# Patient Record
Sex: Male | Born: 1950
Health system: Southern US, Community
[De-identification: ages and names within clinical notes are randomized; demographics above are authoritative.]

## PROBLEM LIST (undated history)

## (undated) DIAGNOSIS — D649 Anemia, unspecified: Secondary | ICD-10-CM

## (undated) DIAGNOSIS — N189 Chronic kidney disease, unspecified: Secondary | ICD-10-CM

## (undated) DIAGNOSIS — F319 Bipolar disorder, unspecified: Secondary | ICD-10-CM

## (undated) DIAGNOSIS — E785 Hyperlipidemia, unspecified: Secondary | ICD-10-CM

## (undated) DIAGNOSIS — S301XXA Contusion of abdominal wall, initial encounter: Secondary | ICD-10-CM

## (undated) DIAGNOSIS — E781 Pure hyperglyceridemia: Secondary | ICD-10-CM

## (undated) DIAGNOSIS — F329 Major depressive disorder, single episode, unspecified: Secondary | ICD-10-CM

## (undated) DIAGNOSIS — H269 Unspecified cataract: Secondary | ICD-10-CM

## (undated) DIAGNOSIS — T7840XA Allergy, unspecified, initial encounter: Secondary | ICD-10-CM

## (undated) DIAGNOSIS — K219 Gastro-esophageal reflux disease without esophagitis: Secondary | ICD-10-CM

## (undated) DIAGNOSIS — R161 Splenomegaly, not elsewhere classified: Secondary | ICD-10-CM

## (undated) DIAGNOSIS — F32A Depression, unspecified: Secondary | ICD-10-CM

## (undated) DIAGNOSIS — F4312 Post-traumatic stress disorder, chronic: Secondary | ICD-10-CM

## (undated) DIAGNOSIS — Z8739 Personal history of other diseases of the musculoskeletal system and connective tissue: Secondary | ICD-10-CM

## (undated) DIAGNOSIS — S069X9A Unspecified intracranial injury with loss of consciousness of unspecified duration, initial encounter: Secondary | ICD-10-CM

## (undated) DIAGNOSIS — F419 Anxiety disorder, unspecified: Secondary | ICD-10-CM

## (undated) HISTORY — PX: SHOULDER FOREIGN BODY REMOVAL: SUR1119

## (undated) HISTORY — DX: Anxiety disorder, unspecified: F41.9

## (undated) HISTORY — DX: Unspecified cataract: H26.9

## (undated) HISTORY — DX: Hyperlipidemia, unspecified: E78.5

## (undated) HISTORY — DX: Depression, unspecified: F32.A

## (undated) HISTORY — PX: COLONOSCOPY: SHX174

## (undated) HISTORY — DX: Anemia, unspecified: D64.9

## (undated) HISTORY — DX: Allergy, unspecified, initial encounter: T78.40XA

## (undated) HISTORY — DX: Chronic kidney disease, unspecified: N18.9

## (undated) HISTORY — DX: Gastro-esophageal reflux disease without esophagitis: K21.9

## (undated) HISTORY — PX: LEG SURGERY: SHX1003

## (undated) HISTORY — DX: Pure hyperglyceridemia: E78.1

---
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
## 2002-09-25 ENCOUNTER — Inpatient Hospital Stay (HOSPITAL_COMMUNITY): Admission: EM | Admit: 2002-09-25 | Discharge: 2002-09-29 | Payer: Self-pay | Admitting: Psychiatry

## 2002-10-09 ENCOUNTER — Inpatient Hospital Stay (HOSPITAL_COMMUNITY): Admission: EM | Admit: 2002-10-09 | Discharge: 2002-10-16 | Payer: Self-pay | Admitting: Psychiatry

## 2002-10-16 ENCOUNTER — Other Ambulatory Visit (HOSPITAL_COMMUNITY): Admission: RE | Admit: 2002-10-16 | Discharge: 2002-10-25 | Payer: Self-pay | Admitting: Psychiatry

## 2002-11-01 ENCOUNTER — Encounter: Admission: RE | Admit: 2002-11-01 | Discharge: 2002-11-01 | Payer: Self-pay | Admitting: Psychiatry

## 2002-11-15 ENCOUNTER — Encounter: Admission: RE | Admit: 2002-11-15 | Discharge: 2002-11-15 | Payer: Self-pay | Admitting: Psychiatry

## 2005-06-16 ENCOUNTER — Ambulatory Visit: Payer: Self-pay | Admitting: Internal Medicine

## 2005-06-22 ENCOUNTER — Ambulatory Visit: Payer: Self-pay | Admitting: Internal Medicine

## 2007-06-22 ENCOUNTER — Encounter: Admission: RE | Admit: 2007-06-22 | Discharge: 2007-06-22 | Payer: Self-pay | Admitting: Internal Medicine

## 2007-09-22 ENCOUNTER — Encounter: Admission: RE | Admit: 2007-09-22 | Discharge: 2007-09-22 | Payer: Self-pay | Admitting: Orthopedic Surgery

## 2007-09-25 ENCOUNTER — Encounter: Admission: RE | Admit: 2007-09-25 | Discharge: 2007-09-25 | Payer: Self-pay | Admitting: Orthopedic Surgery

## 2008-02-02 ENCOUNTER — Emergency Department (HOSPITAL_COMMUNITY): Admission: EM | Admit: 2008-02-02 | Discharge: 2008-02-03 | Payer: Self-pay | Admitting: Emergency Medicine

## 2008-02-28 ENCOUNTER — Inpatient Hospital Stay (HOSPITAL_COMMUNITY): Admission: RE | Admit: 2008-02-28 | Discharge: 2008-03-02 | Payer: Self-pay | Admitting: Orthopedic Surgery

## 2008-03-02 HISTORY — PX: TOTAL HIP ARTHROPLASTY: SHX124

## 2010-07-15 NOTE — Op Note (Signed)
NAME:  Hector Gomez, Hector Gomez NO.:  [id number]   MEDICAL RECORD NO.:  [id number]          PATIENT TYPE:  INP   LOCATION:  1607                         FACILITY:  [facility]   PHYSICIAN:  Madlyn Frankel. Charlann Boxer, M.D.  DATE OF BIRTH:  [birth year]   DATE OF PROCEDURE:  02/28/2008  DATE OF DISCHARGE:                               OPERATIVE REPORT   PREOPERATIVE DIAGNOSIS:  Left hip avascular necrosis.   POSTOPERATIVE DIAGNOSIS:  Left hip avascular necrosis.   PROCEDURE:  Left total hip replacement.   COMPONENTS USED:  DePuy hip system, size 52 Pinnacle cup, 36 neutral  liner, a 6 high Tri-Lock stem with a 36+5 ball.   SURGEON:  Madlyn Frankel. Charlann Boxer, M.D.   ASSISTANT:  Dwyane Luo, PA-C   ANESTHESIA:  General.   BLOOD LOSS:  300.   SPECIMENS:  None.   FINDINGS:  None.   COMPLICATIONS:  None.   DRAINS:  One Hemovac.   INDICATIONS FOR PROCEDURE:  [name] is a 60 year old gentleman who  presented to the office for evaluation of some persistent left proximal  thigh discomfort.  Radiographs and workup had included MRI indicating  avascular changes to the left hip.  He had failed to respond to  conservative measures and at this point wished to proceed with  arthroplasty.  Risks of infection, DVT, component failure, dislocation,  need for revision surgery and the post-operative expectations were all  discussed and reviewed.  Consent was obtained for benefit of pain  relief.   PROCEDURE IN DETAIL:  The patient was brought to the operative theater.  Once adequate anesthesia, preoperative antibiotics, Ancef administered,  the patient was positioned in the right lateral decubitus position with  left side up.  We did a time-out confirming the patient and extremity.  Left lower extremity was then pre-scrubbed, then prepped and draped in a  sterile fashion.  A lateral-based incision was made for posterior  approach to the hip.  The short external rotators were identified and  taken down,  except for the posterior capsule, after opening up the  iliotibial band and gluteal fascia.   The head was dislocated, neck osteotomy made, based off anatomic  landmarks, preoperative templating.  A large area in the anterior  superior aspect of the femoral head was noted to have subchondral  collapse and fragmented cartilage.   Attention was first directed to the femur.  Femoral canal was opened  with a drill.  We used a box osteotome to assure laterality.  I then  hand-reamed once and then irrigated the canal to prevent fat emboli.  I  began broaching with a size 2, setting my anteversion at 20-25 degrees,  then broached up to a size 6.  I used a calcar planer to finish off the  medial neck.   At this point, I packed the femur and attended to the acetabulum.  Acetabular exposure was obtained, labrum removed.  I began reaming with  a 43 reamer, then reamed up to a 51 reamer with excellent bony bed  preparation.  I subsequently impacted a 52 mm Pinnacle cup with  great  initial fixation.  Two screws were placed to supplement this.  A central  hole eliminator was placed and the final 36 metal liner impacted into  position.  Final cup position was at 40 degrees of abduction, 20 degrees  of forward flexion beneath the anterior rim.   At this point I did a trial reduction with the 6 high neck and a 36+1  ball initially.  When I did this, the hip stability was very good with  the combined anteversion at 45-50 degrees.  There were a few millimeters  of shuck, however, and the leg length appeared to be a little shorter  than the down leg.  For this reason I chose to up-size it to a 5 mm  ball, which gave me 1.5 mm of length.   When I did this it tightened up the hip nicely.  Leg lengths appeared to  be equal with this 6 mm length.  The hip was very stable without  evidence of impingement.   At this point we removed all trial components.  The canal was irrigated  and the final 6 high  Tri-Lock stem was then impacted at the level where  the broach was.  Based on this and my previous trials, the 36+5 ball was  opened and impacted onto a cleaned and dried trunnion and the hip  reduced.  We irrigated the hip throughout the case.  Again at this point  there was no active bleeding that required hemostasis.  I reapproximated  the posterior capsule, the superior leaflet.  I then placed a medium  Hemovac drain deep.  The iliotibial band and gluteal fascia were  reapproximated using #1 Vicryl.  The remainder of the wound was closed  with 2-0 Vicryl and 4-0 running Monocryl.  Hip was cleaned, dried and  dressed sterilely with Steri-Strips and a Mepilex dressing and he was  brought to the recovery room in stable condition, tolerated the  procedure well.      Madlyn Frankel Charlann Boxer, M.D.  Electronically Signed     MDO/MEDQ  D:  02/28/2008  T:  02/29/2008  Job:  [id number]

## 2010-07-15 NOTE — H&P (Signed)
NAME:  Hector Gomez, Hector Gomez NO.:  [id number]   MEDICAL RECORD NO.:  [id number]          PATIENT TYPE:  INP   LOCATION:                               FACILITY:  [facility]   PHYSICIAN:  Madlyn Frankel. Charlann Boxer, M.D.  DATE OF BIRTH:  09-Oct-1950   DATE OF ADMISSION:  02/28/2008  DATE OF DISCHARGE:                              HISTORY & PHYSICAL   PROCEDURE:  Left total hip replacement.   CHIEF COMPLAINT:  Left hip pain.   HISTORY OF PRESENT ILLNESS:  A 60 year old male with a history of left  hip pain secondary to avascular necrosis.  It has been refractory to all  conservative treatment.  Presurgically assessed prior to surgery.   PRIMARY CARE PHYSICIAN:  [name], MD.   PAST MEDICAL HISTORY:  1. Avascular necrosis.  2. Chemical imbalance.   ALLERGIES:  NO KNOWN DRUG ALLERGIES.   MEDICATIONS:  1. Lamictal, unknown dosage amount.  2. Ambien 10 mg p.o. q.h.s.  3. Clonazepam 0.5 mg p.r.n.  4. Trazodone p.r.n.  5. Risperdal, unknown dosage amount.  6. Xanax 0.5 mg p.o. p.r.n. for anxiety.  7. Tricor daily, unknown dosage amount.  8. Crestor, unknown dosage amount twice weekly.   Verify dosage and frequencies with the patient at time of admission.   PAST SURGICAL HISTORY:  None.   FAMILY HISTORY:  Lung cancer.   SOCIAL HISTORY:  He is separated.  He is planning on postoperative rehab  with home health care physical therapy at home.   REVIEW OF SYSTEMS:  None other than in the HPI.   PHYSICAL EXAMINATION:  VITAL SIGNS:  Pulse 72, respirations 16, blood  pressure 116/76.  GENERAL:  Awake, alert, oriented, well-developed, well-nourished.  HEENT:  Normocephalic.  NECK:  Supple.  No carotid bruits.  CHEST/LUNGS:  Clear to auscultation bilaterally.  BREASTS:  Deferred.  HEART:  Regular rate and rhythm.  S1-S2 distinct.  ABDOMEN:  Bowel sounds are present.  PELVIS:  Stable.  GENITOURINARY:  Deferred.  EXTREMITIES:  Left hip decreased range of motion, increased  pain.  SKIN:  No cellulitis.  NEUROLOGIC:  Intact distal sensibilities.   LABORATORY DATA:  Labs, EKG, chest x-ray are all pending presurgical  testing.   IMPRESSION:  Left hip avascular necrosis.   PLAN OF ACTION:  Left total hip replacement February 28, 2008 at [facility].  Risks and complications were discussed.   SURGEON:  Madlyn Frankel. Charlann Boxer, M.D.   POSTOPERATIVE MEDICATIONS:  Provided at time of history and physical  today which included:  1. Aspirin.  2. Robaxin.  3. Iron.  4. Colace.  5. MiraLax.  6. Ambien.  7. Celebrex.     ______________________________  Yetta Glassman. [address], [place]      Madlyn Frankel. Charlann Boxer, M.D.  Electronically Signed    BLM/MEDQ  D:  02/01/2008  T:  02/01/2008  Job:  [id number]   cc:   [name], MD  Fax: [phone]

## 2010-07-18 NOTE — Discharge Summary (Signed)
NAME:  Hector Gomez, Hector Gomez NO.:  [id number]   MEDICAL RECORD NO.:  [id number]          PATIENT TYPE:  INP   LOCATION:  1607                         FACILITY:  [facility]   PHYSICIAN:  Madlyn Frankel. Charlann Boxer, M.D.  DATE OF BIRTH:  11-28-1950   DATE OF ADMISSION:  02/28/2008  DATE OF DISCHARGE:  03/02/2008                               DISCHARGE SUMMARY   ADMITTING DIAGNOSES:  1. Avascular necrosis.  2. Chemical imbalance.   DISCHARGE DIAGNOSES:  1. Avascular necrosis.  2. Chemical imbalance.   HISTORY OF PRESENT ILLNESS:  A 60 year old male with a history of left  hip pain secondary to avascular necrosis.  Refractory to all  conservative treatment.   CONSULTS:  None.   PROCEDURE:  Left total hip replacement by surgeon, Dr. Durene Romans.  Assistant, Dwyane Luo P.A.-C.   LABORATORY DATA:  CBC, final reading, white blood cell count 4.9,  hemoglobin 10, hematocrit 29.6, platelets 200.  White cell differential  normal.  Coags normal.  Metabolic panel, final reading, sodium 142,  potassium 4.9, glucose 79, BUN was 11, creatinine 1.4.  Calcium 8.5.  UA  negative.   HOSPITAL COURSE:  Patient underwent a left total hip replacement,  tolerated procedure well.  Stay was unremarkable.  He remained  hemodynamically and orthopedically stable.  Dressing was changed.  There  was no drainage from the wound.  He was weightbearing as tolerated and  made good progress with physical therapy and was stable after 2 days of  pain well controlled.   DISCHARGE DISPOSITION:  Discharged home with home health care PT, stable  and improved condition.   DISCHARGE PHYSICAL THERAPY:  Weightbearing as tolerated with the use  rolling walker.   DISCHARGE DIET:  Heart healthy.   DISCHARGE WOUND CARE:  Keep dry.   DISCHARGE MEDICATIONS:  1. Aspirin 325 mg 1 p.o. b.i.d.  2. Robaxin 500 mg p.o. q.6.  3. Iron 325 mg p.o. t.i.d.  4. Colace 100 mg p.o. b.i.d.  5. MiraLax 17 g p.o. daily.  6. Norco  7.5/325 one to two p.o. q.4 to 6 p.r.n. pain.  7. Celebrex 200 mg 1 p.o. b.i.d. x2 weeks.  8. Crestor 5 mg 1 on Friday and 1 on Monday.  9. Trazodone 100 mg 1 to 2 tabs q.h.s. p.r.n.  10.Alprazolam 0.5 mg 1 to 2 daily p.r.n.  11.Risperidone 1 mg q.h.s.  12.Tricor 145 mg p.o. q.h.s.  13.Ambien CR 12.5 mg p.o. q.h.s.  14.Lamotrigine 200 mg p.o. q.h.s.  15.Clonazepam 0.5 mg q.h.s.   DISCHARGE FOLLOWUP:  Follow up with Dr. Charlann Boxer at phone number [phone] in  2 weeks for wound check.     ______________________________  Yetta Glassman. [address], [place]      Madlyn Frankel. Charlann Boxer, M.D.  Electronically Signed    BLM/MEDQ  D:  03/27/2008  T:  03/27/2008  Job:  [id number]   cc:   [name], MD  Fax: [phone]
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
## 2010-07-18 NOTE — Discharge Summary (Signed)
NAME:  Hector Gomez, Hector Gomez NO.:  [id number]   MEDICAL RECORD NO.:  [id number]                   PATIENT TYPE:  IPS   LOCATION:  0302                                 [facility]ILITY[facility]  [facility]   PHYSICIAN:  Jeanice Lim, M.D.              DATE OF BIRTH:  1950/08/04   DATE OF ADMISSION:  10/09/2002  DATE OF DISCHARGE:  10/16/2002                                 DISCHARGE SUMMARY   IDENTIFYING DATA:  This is a 60 year old Caucasian male, divorced,  involuntarily admitted, petitioned by aunt for strange, threatening  behavior, ringing doorbells in the neighborhood, actually entering one  residence without permission.  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  He was  having clear mood swings and not taking his medications, and felt that he  had been saved and did not need medications.  He was irritable, impulsive,  and had very impaired judgment at the time of admission, and family members  and friends were concerned about him.   MEDICATIONS:  Patient had been noncompliant with his Zyprexa, had taken [facility].  John's Wort intermittently in the past, but not recently as per patient, and  denied other drug use.   ALLERGIES:  No known drug allergies.   PHYSICAL EXAMINATION:  Within normal limits.  Neurologic:  Nonfocal.   ROUTINE ADMISSION LABORATORIES:  Essentially within normal limits.   MENTAL STATUS EXAM:  Fully alert male.  Anxious affect.  Mildly irritable,  but redirectable sitting still, fidgeting somewhat.  Speech:  Mildly  pressured, hyperverbal.  Mood:  Elevated, hyperreligious, __________  grandiosity.  Sees no problem in his behaviors and feels that maybe his  family or friends or neighbors needed to be evaluated rather than him.  Felt  there might be a plot against him and that he was maybe being poisoned.  His  cognition was intact.  Judgment and insight poor, impulse control impaired.  Mood  was clearly labile with racing thoughts at time and some tangential  thoughts with partial insight able to gather himself for at least short  periods of time.   ADMISSION DIAGNOSES:   AXIS I:  Bipolar I disorder, manic.   AXIS II:  None.   AXIS III:  None.   AXIS IV:  Moderate, related to limited support system, stressors at job, and  recent onset of mood disorder.   AXIS V:  19/75-80.   HOSPITAL COURSE:  Patient was admitted and ordered routine p.r.n.  medications, underwent further monitoring.  Was encouraged to participate in  individual, group, and __________ therapy.  Patient was resumed on Zyprexa  and then Zyprexa was optimized due to clear delusional thinking and  moveability.  Patient was ordered lithium and due to increase in  triglycerides, which were significantly elevated, patient was changed to  Risperdal and patient was started on Tricor.  Patient was to follow up with  Willaim Sheng for therapy and to continue to follow up with Dr. Kathrynn Running,  which he had not kept his appointment prior to this admission.  He reported  motivation to be compliant, reported a positive response to medication.  His  mood clearly stabilized.  His thought process was more goal-directed and he  was completely reality-based with no evidence of psychotic symptoms at the  time of discharge.  Risk, benefit ratio and alternative treatments regarding  medications were discussed as well as side effects from underlying case.  Patient was comfortable with this medication regimen, although he clearly  did not like to take medicine.  He was discharged on Risperdal 10 mg at  bedtime, Ambien 10 mg q.h.s. p.r.n., and Tricor 54 mg q.a.m.  Patient did  not want to take lithium and seemed to respond to the Risperdal, and  therefore was discharged to follow up with his primary care physician  regarding elevation of triglycerides and the [facility] for  intensive outpatient until further  stabilized starting on August 17 and  August 30.   DISCHARGE DIAGNOSES:   AXIS I:  Bipolar I disorder, manic.   AXIS II:  None.   AXIS III:  None.   AXIS IV:  Moderate, related to limited support system, stressors at job, and  recent onset of mood disorder.   AXIS V:  GAF is 50-55.                                                 Jeanice Lim, M.D.    Lovie Macadamia  D:  11/06/2002  T:  11/07/2002  Job:  [id number]
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
## 2010-12-05 LAB — DIFFERENTIAL
Basophils Absolute: 0 10*3/uL (ref 0.0–0.1)
Basophils Absolute: 0 10*3/uL (ref 0.0–0.1)
Basophils Relative: 1 % (ref 0–1)
Eosinophils Absolute: 0.1 10*3/uL (ref 0.0–0.7)
Eosinophils Relative: 1 % (ref 0–5)
Eosinophils Relative: 3 % (ref 0–5)
Lymphocytes Relative: 17 % (ref 12–46)
Monocytes Absolute: 0.3 10*3/uL (ref 0.1–1.0)
Monocytes Absolute: 0.7 10*3/uL (ref 0.1–1.0)

## 2010-12-05 LAB — URINALYSIS, ROUTINE W REFLEX MICROSCOPIC
Bilirubin Urine: NEGATIVE
Bilirubin Urine: NEGATIVE
Glucose, UA: NEGATIVE mg/dL
Hgb urine dipstick: NEGATIVE
Ketones, ur: NEGATIVE mg/dL
Specific Gravity, Urine: 1.015 (ref 1.005–1.030)
Urobilinogen, UA: 0.2 mg/dL (ref 0.0–1.0)
pH: 5.5 (ref 5.0–8.0)

## 2010-12-05 LAB — CBC
HCT: 29.6 % — ABNORMAL LOW (ref 39.0–52.0)
HCT: 31.6 % — ABNORMAL LOW (ref 39.0–52.0)
HCT: 38.9 % — ABNORMAL LOW (ref 39.0–52.0)
Hemoglobin: 10 g/dL — ABNORMAL LOW (ref 13.0–17.0)
Hemoglobin: 13.3 g/dL (ref 13.0–17.0)
MCHC: 34.1 g/dL (ref 30.0–36.0)
MCV: 88.5 fL (ref 78.0–100.0)
Platelets: 200 10*3/uL (ref 150–400)
Platelets: 237 10*3/uL (ref 150–400)
Platelets: 274 10*3/uL (ref 150–400)
RBC: 3.35 MIL/uL — ABNORMAL LOW (ref 4.22–5.81)
RBC: 4.43 MIL/uL (ref 4.22–5.81)
RDW: 12.2 % (ref 11.5–15.5)
WBC: 4.9 10*3/uL (ref 4.0–10.5)
WBC: 5.1 10*3/uL (ref 4.0–10.5)
WBC: 5.6 10*3/uL (ref 4.0–10.5)

## 2010-12-05 LAB — BASIC METABOLIC PANEL
BUN: 11 mg/dL (ref 6–23)
BUN: 9 mg/dL (ref 6–23)
CO2: 26 mEq/L (ref 19–32)
Calcium: 9.3 mg/dL (ref 8.4–10.5)
Chloride: 112 mEq/L (ref 96–112)
GFR calc non Af Amer: 53 mL/min — ABNORMAL LOW (ref >60)
Glucose, Bld: 86 mg/dL (ref 70–99)
Potassium: 3.7 mEq/L (ref 3.5–5.1)
Potassium: 4 mEq/L (ref 3.5–5.1)
Potassium: 4.2 mEq/L (ref 3.5–5.1)
Sodium: 133 mEq/L — ABNORMAL LOW (ref 135–145)
Sodium: 140 mEq/L (ref 135–145)
Sodium: 142 mEq/L (ref 135–145)

## 2010-12-05 LAB — COMPREHENSIVE METABOLIC PANEL
ALT: 21 U/L (ref 0–53)
AST: 20 U/L (ref 0–37)
Albumin: 3.8 g/dL (ref 3.5–5.2)
Alkaline Phosphatase: 68 U/L (ref 39–117)
Chloride: 103 mEq/L (ref 96–112)
Creatinine, Ser: 1.75 mg/dL — ABNORMAL HIGH (ref 0.4–1.5)
GFR calc Af Amer: 49 mL/min — ABNORMAL LOW (ref >60)
Potassium: 3.7 mEq/L (ref 3.5–5.1)
Sodium: 136 mEq/L (ref 135–145)
Total Bilirubin: 0.9 mg/dL (ref 0.3–1.2)

## 2010-12-05 LAB — TYPE AND SCREEN

## 2010-12-05 LAB — ABO/RH: ABO/RH(D): O POS

## 2011-10-02 ENCOUNTER — Telehealth: Payer: Self-pay | Admitting: Hematology and Oncology

## 2011-10-02 NOTE — Telephone Encounter (Signed)
Referred by Dr. Creola Corn Dx- Leukopenia. NP packet mailed out.

## 2011-10-02 NOTE — Telephone Encounter (Signed)
S/w pt today re appt for 8/15 @ 9:30 am w/LO

## 2011-10-05 ENCOUNTER — Telehealth: Payer: Self-pay | Admitting: Hematology and Oncology

## 2011-10-05 NOTE — Telephone Encounter (Signed)
Ref. Hector Gomez Dx.Leukocytopenia Del.10/05/11

## 2011-10-15 ENCOUNTER — Ambulatory Visit: Payer: 59

## 2011-10-15 ENCOUNTER — Ambulatory Visit (HOSPITAL_BASED_OUTPATIENT_CLINIC_OR_DEPARTMENT_OTHER): Payer: 59 | Admitting: Hematology and Oncology

## 2011-10-15 ENCOUNTER — Encounter: Payer: Self-pay | Admitting: Hematology and Oncology

## 2011-10-15 ENCOUNTER — Telehealth: Payer: Self-pay | Admitting: Hematology and Oncology

## 2011-10-15 ENCOUNTER — Ambulatory Visit (HOSPITAL_BASED_OUTPATIENT_CLINIC_OR_DEPARTMENT_OTHER): Payer: 59 | Admitting: Lab

## 2011-10-15 VITALS — BP 118/76 | HR 80 | Temp 97.0°F | Resp 20 | Ht 68.0 in | Wt 169.1 lb

## 2011-10-15 DIAGNOSIS — D72819 Decreased white blood cell count, unspecified: Secondary | ICD-10-CM | POA: Insufficient documentation

## 2011-10-15 DIAGNOSIS — F319 Bipolar disorder, unspecified: Secondary | ICD-10-CM | POA: Insufficient documentation

## 2011-10-15 LAB — CBC WITH DIFFERENTIAL/PLATELET
Basophils Absolute: 0 10*3/uL (ref 0.0–0.1)
Eosinophils Absolute: 0.1 10*3/uL (ref 0.0–0.5)
HGB: 12.9 g/dL — ABNORMAL LOW (ref 13.0–17.1)
MCV: 83.4 fL (ref 79.3–98.0)
MONO#: 0.3 10*3/uL (ref 0.1–0.9)
MONO%: 14.6 % — ABNORMAL HIGH (ref 0.0–14.0)
NEUT#: 0.6 10*3/uL — ABNORMAL LOW (ref 1.5–6.5)
RBC: 4.49 10*6/uL (ref 4.20–5.82)
RDW: 14.3 % (ref 11.0–14.6)
WBC: 1.9 10*3/uL — ABNORMAL LOW (ref 4.0–10.3)

## 2011-10-15 LAB — MORPHOLOGY: PLT EST: ADEQUATE

## 2011-10-15 NOTE — Progress Notes (Signed)
CC:   [name], MD  IDENTIFYING STATEMENT:  The patient is a 61 year old man seen at request of [name] with leukopenia.  HISTORY OF PRESENT ILLNESS:  The patient recalls being told that his white cell count was trending down a little 6 months ago.  Review of patient's chart notes the following:  03/01/2008 white cell count 4.9, hemoglobin 10, hematocrit 29.6, platelets 200; 09/25/2011 white cell count 2.3, hemoglobin 12.4, hematocrit 35, platelets 213, AST 700.  The patient denies a history of chronic infections specifically skin, upper respiratory tract infection, and gum disease.  He denies fever, chills, or night sweats.  He denies adenopathy.  He has not lost weight.  He has been on psych medications for years and has had no recent changes.  He denies taking over-the-counter herbal remedies.  He denies muscle aches, joint pain, or arthralgia.  He does have a history of avascular necrosis of left hip and did undergo left hip replacement.  He is not sure why this was so.  Of note, I did review a CT scan of the abdomen that he had performed in 2009 which showed that the spleen was enlarged measuring 13.8 x 10 x 14.1.  The liver was unremarkable.  There was no significant lymphadenopathy.  He denies history of alcohol use.  PAST MEDICAL HISTORY: 1. History of avascular necrosis.  2.  Status post left hip     replacement.  3.  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  4.  Hyperlipidemia.  5.  Allergic     rhinitis.  6.  GERD.  7.  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  ALLERGIES:  None.  MEDICATIONS:  Trazodone one daily p.r.n., Ambien 12.5 mg q.h.s., lorazepam 0.5 mg q.h.s., Risperdal 0.5 mg q.h.s., and Xanax 0.5 mg p.r.n.  SOCIAL HISTORY:  The patient is separated with 1 child.  He is a former smoker, smoking less than 1 pack a day, quitting in 2008.  He denies excessive alcohol use.  He is a Merchandiser, retail of a  Physiological scientist.  FAMILY HISTORY:  The patient's father had lung cancer.  Denies hematological disorders.  HEALTH MAINTENANCE:  Received a colonoscopy 5 years ago.  Has close followup with [name].  REVIEW OF SYSTEMS:  Denies fever, chills, night sweats, anorexia, weight loss, pain.  GI:  Denies nausea, vomiting, abdominal pain, diarrhea, melena, hematochezia.  GU:  Denies dysuria, hematuria, nocturia, frequency.  Cardiovascular:  Denies chest pain, PND, orthopnea, ankle swelling.  Respirations:  Denies cough, hemoptysis, wheeze, shortness of breath.  Musculoskeletal:  Presently denies joint aches, muscle pains. Neurologic:  Denies headache, vision change, extremity weakness.  PHYSICAL EXAMINATION:  General:  The patient is a well-appearing, well- nourished man in no distress.  Vitals:  Pulse 80, blood pressure 118/76, temperature 97, respirations 20, weight 169 pounds.  HEENT:  Head is atraumatic, normocephalic.  Extraocular muscles intact.  Sclerae anicteric.  Pupils equal, round, and reactive to light.  Mouth moist without ulcerations, thrush, or lesions.  Neck:  Supple.  Chest:  Clear to percussion and auscultation.  CVS:  Unremarkable.  Abdomen:  Soft, nontender.  Bowel sounds present.  Extremities:  No edema.  Skin:  No bruising.  CNS:  Nonfocal.  IMPRESSION AND PLAN:  Mr. Hector Gomez is a pleasant 61 year old man with mild leukopenia.  A remote CT scan did note an enlarged spleen.  I will have  him repeat an ultrasound of his liver and spleen and possibly repeat a CT scan if needed at later stage.  I will have him repeat a CBC with diff.  I will review morphology.  We will obtain an ANA and C-ANCA. The patient has a history of avascular necrosis of his left hip and may have underlying connective tissue disease.  We will obtain a baseline SPEP with IFE to screen for mild dyscrasia.  I reviewed his medication list and both drugs, Risperdal and clonazepam, can cause  leukopenia with anemia.  The patient is presently asymptomatic.  I have him returning to discuss results in a week and a half's time and at that time further recommendations will follow.  I spent more than half the time coordinating care.    ______________________________ Laurice Record, M.D. LIO/MEDQ  D:  10/15/2011  T:  10/15/2011  Job:  [id number]

## 2011-10-15 NOTE — Patient Instructions (Signed)
Hector Gomez  [id number]  [facility] Discharge Instructions  RECOMMENDATIONS MADE BY THE CONSULTANT AND ANY TEST RESULTS WILL BE SENT TO YOUR REFERRING DOCTOR.   EXAM FINDINGS BY MD TODAY AND SIGNS AND SYMPTOMS TO REPORT TO CLINIC OR PRIMARY MD:   Your current list of medications are: Current Outpatient Prescriptions  Medication Sig Dispense Refill  . ALPRAZolam (XANAX) 0.5 MG tablet Take 0.5 mg by mouth as needed.      . clonazePAM (KLONOPIN) 0.5 MG tablet Take 0.5 mg by mouth at bedtime.      . risperiDONE (RISPERDAL) 0.5 MG tablet Take 0.5 mg by mouth at bedtime.      . TRAZODONE HCL PO Take 1 tablet by mouth as needed.      . zolpidem (AMBIEN CR) 12.5 MG CR tablet Take 12.5 mg by mouth at bedtime.         INSTRUCTIONS GIVEN AND DISCUSSED:   SPECIAL INSTRUCTIONS/FOLLOW-UP:  See above.  I acknowledge that I have been informed and understand all the instructions given to me and received a copy. I do not have any more questions at this time, but understand that I may call the [facility] at [phone] during business hours should I have any further questions or need assistance in obtaining follow-up care.

## 2011-10-15 NOTE — Telephone Encounter (Signed)
gv pt appt schedule for August including appt for [place] 8/26.

## 2011-10-15 NOTE — Progress Notes (Signed)
Dr.    Timothy Lasso      -     Primary  [facility]   On  [address]  Cell    Phone     [phone].

## 2011-10-19 LAB — ANA: Anti Nuclear Antibody(ANA): NEGATIVE

## 2011-10-19 LAB — COMPREHENSIVE METABOLIC PANEL
Albumin: 4 g/dL (ref 3.5–5.2)
Alkaline Phosphatase: 84 U/L (ref 39–117)
BUN: 10 mg/dL (ref 6–23)
CO2: 27 mEq/L (ref 19–32)
Calcium: 9.2 mg/dL (ref 8.4–10.5)
Chloride: 107 mEq/L (ref 96–112)
Glucose, Bld: 89 mg/dL (ref 70–99)
Potassium: 4 mEq/L (ref 3.5–5.3)
Sodium: 141 mEq/L (ref 135–145)
Total Protein: 7.3 g/dL (ref 6.0–8.3)

## 2011-10-19 LAB — PROTEIN ELECTROPHORESIS, SERUM
Albumin ELP: 52.6 % — ABNORMAL LOW (ref 55.8–66.1)
Alpha-1-Globulin: 4.2 % (ref 2.9–4.9)
Beta 2: 8.5 % — ABNORMAL HIGH (ref 3.2–6.5)
Total Protein, Serum Electrophoresis: 7.3 g/dL (ref 6.0–8.3)

## 2011-10-19 LAB — ANCA TITERS

## 2011-10-19 LAB — ANCA SCREEN W REFLEX TITER: Atypical p-ANCA Screen: NEGATIVE

## 2011-10-26 ENCOUNTER — Other Ambulatory Visit (HOSPITAL_COMMUNITY): Payer: 59

## 2011-10-27 ENCOUNTER — Ambulatory Visit (HOSPITAL_COMMUNITY)
Admission: RE | Admit: 2011-10-27 | Discharge: 2011-10-27 | Disposition: A | Discharge status: Home / Self Care | Payer: 59 | Source: Ambulatory Visit | Attending: Hematology and Oncology | Admitting: Hematology and Oncology

## 2011-10-27 DIAGNOSIS — D709 Neutropenia, unspecified: Secondary | ICD-10-CM | POA: Insufficient documentation

## 2011-10-27 DIAGNOSIS — R161 Splenomegaly, not elsewhere classified: Secondary | ICD-10-CM | POA: Insufficient documentation

## 2011-10-27 DIAGNOSIS — D72819 Decreased white blood cell count, unspecified: Secondary | ICD-10-CM

## 2011-10-28 ENCOUNTER — Ambulatory Visit (HOSPITAL_BASED_OUTPATIENT_CLINIC_OR_DEPARTMENT_OTHER): Payer: 59 | Admitting: Hematology and Oncology

## 2011-10-28 ENCOUNTER — Other Ambulatory Visit (HOSPITAL_COMMUNITY)
Admission: RE | Admit: 2011-10-28 | Discharge: 2011-10-28 | Disposition: A | Discharge status: Home / Self Care | Payer: 59 | Source: Ambulatory Visit | Attending: Hematology and Oncology | Admitting: Hematology and Oncology

## 2011-10-28 ENCOUNTER — Encounter: Payer: Self-pay | Admitting: Hematology and Oncology

## 2011-10-28 ENCOUNTER — Telehealth: Payer: Self-pay | Admitting: Hematology and Oncology

## 2011-10-28 ENCOUNTER — Ambulatory Visit (HOSPITAL_BASED_OUTPATIENT_CLINIC_OR_DEPARTMENT_OTHER): Payer: 59 | Admitting: Lab

## 2011-10-28 VITALS — BP 141/92 | HR 90 | Temp 98.3°F | Resp 18 | Ht 68.0 in | Wt 167.7 lb

## 2011-10-28 DIAGNOSIS — D72819 Decreased white blood cell count, unspecified: Secondary | ICD-10-CM

## 2011-10-28 DIAGNOSIS — R161 Splenomegaly, not elsewhere classified: Secondary | ICD-10-CM

## 2011-10-28 DIAGNOSIS — D709 Neutropenia, unspecified: Secondary | ICD-10-CM

## 2011-10-28 HISTORY — DX: Splenomegaly, not elsewhere classified: R16.1

## 2011-10-28 LAB — VITAMIN B12: Vitamin B-12: 322 pg/mL (ref 211–911)

## 2011-10-28 NOTE — Telephone Encounter (Signed)
Gave pt appt for September 2013 lab , BMBX, and sent pt lab

## 2011-10-28 NOTE — Patient Instructions (Signed)
Hector Gomez  [id number]   [facility] - AFTER VISIT SUMMARY   **RECOMMENDATIONS MADE BY THE CONSULTANT AND ANY TEST    RESULTS WILL BE SENT TO YOUR REFERRING DOCTORS.   YOUR EXAM FINDINGS, LABS AND RESULTS WERE DISCUSSED BY YOUR MD TODAY.    Your vital signs are: Filed Vitals:   10/28/11 1422  BP: 141/92  Pulse: 90  Temp: 98.3 F (36.8 C)  Resp: 18    Your Updated drug allergies are: Allergies as of 10/28/2011 - Review Complete 10/28/2011  Allergen Reaction Noted  . Adhesive (tape) Rash 10/28/2011    Your current list of medications are: Current Outpatient Prescriptions  Medication Sig Dispense Refill  . ALPRAZolam (XANAX) 0.5 MG tablet Take 0.5 mg by mouth as needed.      . clonazePAM (KLONOPIN) 0.5 MG tablet Take 0.5 mg by mouth at bedtime.      . fenofibrate (TRICOR) 145 MG tablet Take 145 mg by mouth daily.      . Omega-3 Fatty Acids (FISH OIL PO) Take 1,400 mg by mouth daily.      . risperiDONE (RISPERDAL) 0.5 MG tablet Take 0.5 mg by mouth at bedtime.      . rosuvastatin (CRESTOR) 10 MG tablet Take 10 mg by mouth 2 (two) times a week. Monday and Friday      . TRAZODONE HCL PO Take 1 tablet by mouth as needed.      . zolpidem (AMBIEN CR) 12.5 MG CR tablet Take 12.5 mg by mouth at bedtime.         INSTRUCTIONS GIVEN AND DISCUSSED:  See attached schedule   SPECIAL INSTRUCTIONS/FOLLOW-UP:  See above.  I acknowledge that I have been informed and understand all the instructions given to me and received a copy. I do not have any more questions at this time, but understand that I may call the [facility] at [phone] during business hours should I have any further questions or need assistance in obtaining follow-up care.

## 2011-10-28 NOTE — Progress Notes (Signed)
This office note has been dictated.

## 2011-10-28 NOTE — Progress Notes (Signed)
Pt is scheduled for Bone Marrow Biopsy on  11/06/11  At  1030 am.   Both Margaret in pathology lab and Marcelino Duster, infusion scheduler have been notified.  Verbal and written instructions given to pt.   Pt voiced understanding.

## 2011-10-28 NOTE — Progress Notes (Signed)
CC:   [name], MD [name], M.D.  IDENTIFYING STATEMENT:  The patient is a 61 year old man who presented to discuss results.  INTERVAL HISTORY:  In summary, the patient was referred for progressive leukopenia.  He remains asymptomatic with no recent history for infection,  skin or gum disease.  Denied B symptoms.  He does have a history for avascular necrosis of hip and is status post hip replacement.  He has does have on Splenomegaly documented on previous CT scan.  RESULTS:  Note the following:  10/15/2011 white cell count 1.9, hemoglobin 12.9, hematocrit 37.4, platelets 202, ANC 600.  Morphology noted slight polychromasia.  White blood cells essentially unremarkable. Protein electrophoresis did not detect an M spike.  ANA negative but p- ANCA screen was positive and p-ANCA titer was high at 1:640.  Ultrasound on 10/27/2011 noted a grossly normal liver with normal echogenicity.  No masses or nodularity identified.  The spleen appeared enlarged measuring 12.8 cm.  No focal mass lesions were noted.  MEDICATIONS:  Trazodone, Risperdal, Xanax, Klonopin, TriCor, fish oil, Crestor, Ambien.  ALLERGIES:  Adhesive tape.  PAST MEDICAL HISTORY/FAMILY HISTORY/SOCIAL HISTORY:  Unchanged.  REVIEW OF SYSTEMS:  Ten-point review of systems negative.  Specifically, no history for arthritis.  PHYSICAL EXAM:  General:  Patient is alert and oriented x3.  Vitals: Pulse 90, blood pressure 141/92, temp 98.3, respirations 18, weight 167.7 pounds.  HEENT:  Head is atraumatic, normocephalic.  Sclerae anicteric.  Mouth moist.  Neck:  Supple.  Chest/CVS:  Unremarkable. Abdomen:  Soft, nontender.  Spleen not palpable.  Bowel sounds present. Extremities:  No calf tenderness or edema.  LABORATORY DATA:  As above.  In addition, sodium 141, potassium 4, chloride 107, CO2 27, BUN 10, creatinine 1.28, glucose 89.  T Bili 0.4, alkaline phosphatase 84, AST 15, ALT 11, calcium 9.2.  IMPRESSION AND  PLAN:  Mr. Murguia is a 61 year old man with neutropenia, splenomegaly, and high titers of p-ANCA.  Status post left hip replacement for avascular necrosis.  Part of the differential includes Felty's syndrome versus drug reaction, i.e., Risperdal and clonazepam can cause leukopenia versus infection, versus myeloproliferative disorder.  I would recommend that we proceed with a bone marrow biopsy to investigate if there are any underlying issues for bone marrow effects.  I also suggest that we obtain peripheral blood for flow to look for large granulocytic leukemia cells if any.  We will also obtain HIV and vitamin B12 level.  The patient has been scheduled to return for bone marrow exam on November 06, 2011.  I have also asked him  to touch base with [name] his psychiatrist to address his medications. He needs  feel that he needs to touch base with [name] regarding connective tissue disorder.    ______________________________ Laurice Record, M.D. LIO/MEDQ  D:  10/28/2011  T:  10/28/2011  Job:  [id number]

## 2011-11-04 LAB — FLOW CYTOMETRY

## 2011-11-06 ENCOUNTER — Other Ambulatory Visit (HOSPITAL_COMMUNITY)
Admission: RE | Admit: 2011-11-06 | Discharge: 2011-11-06 | Disposition: A | Discharge status: Home / Self Care | Payer: 59 | Source: Ambulatory Visit | Attending: Hematology and Oncology | Admitting: Hematology and Oncology

## 2011-11-06 ENCOUNTER — Ambulatory Visit (HOSPITAL_BASED_OUTPATIENT_CLINIC_OR_DEPARTMENT_OTHER): Payer: 59 | Admitting: Hematology and Oncology

## 2011-11-06 ENCOUNTER — Encounter: Payer: 59 | Admitting: Hematology and Oncology

## 2011-11-06 ENCOUNTER — Other Ambulatory Visit: Payer: 59 | Admitting: Lab

## 2011-11-06 ENCOUNTER — Other Ambulatory Visit: Payer: Self-pay | Admitting: *Deleted

## 2011-11-06 VITALS — BP 144/91 | HR 76 | Temp 97.1°F

## 2011-11-06 DIAGNOSIS — D709 Neutropenia, unspecified: Secondary | ICD-10-CM

## 2011-11-06 DIAGNOSIS — D72819 Decreased white blood cell count, unspecified: Secondary | ICD-10-CM

## 2011-11-06 DIAGNOSIS — D61818 Other pancytopenia: Secondary | ICD-10-CM | POA: Insufficient documentation

## 2011-11-06 NOTE — Procedures (Signed)
BONE MARROW PROCEDURE NOTE  Following consent, the right skin and iliac crest were cleaned and anesthestized with 2% lidocaine.  A bone marrow and biopsy were performed on separate sites of the iliac crest.  The patient tolerated the procedure well.  The were no complications.  The area was pressure dressed.  The patient was told to keep the area clean and dry for at least 24 hours.  He was to report excessive bleeding. The reason for the bone marrow was to evaluate persistent neutropenia.  The patient follows up to discuss results.

## 2011-11-06 NOTE — Progress Notes (Signed)
1211: Dressing dry and intact. Pt states he is feeling OK.

## 2011-11-06 NOTE — Patient Instructions (Addendum)
[facility] Discharge Instructions for Post Bone Marrow Procedure  Today you had a bone marrow biopsy and aspirate of  Right hip.  Please keep the pressure dressing in place for at least 24 hours.  Have someone check your dressing periodically for bleeding.  If needed you can reapply a pressure dressing to the site.  Take pain medication  Tylenol  as directed.  IF BLEEDING REOCCURS THAT SHOULD BE REPORTED IMMEDIATELY. Call the Cancer Center at [phone] if during business hours. Or report to the Emergency Room.   I have been informed and understand all the instructions given to me. I know to contact the clinic, my physician, or go to the Emergency Department if any problems should occur. I do not have any questions at this time, but understand that I may call the clinic during office hours at [phone]  should I have any questions or need assistance in obtaining follow up care.    __________________________________________  _____________  __________ Signature of Patient or Authorized Representative            Date                   Time    __________________________________________ Nurse's Signature

## 2011-11-09 NOTE — Progress Notes (Signed)
This encounter was created in error - please disregard.

## 2011-11-20 ENCOUNTER — Ambulatory Visit (HOSPITAL_BASED_OUTPATIENT_CLINIC_OR_DEPARTMENT_OTHER): Payer: 59 | Admitting: Family

## 2011-11-20 ENCOUNTER — Other Ambulatory Visit (HOSPITAL_BASED_OUTPATIENT_CLINIC_OR_DEPARTMENT_OTHER): Payer: 59

## 2011-11-20 ENCOUNTER — Telehealth: Payer: Self-pay | Admitting: Hematology and Oncology

## 2011-11-20 ENCOUNTER — Encounter: Payer: Self-pay | Admitting: Family

## 2011-11-20 VITALS — BP 129/80 | HR 102 | Temp 97.1°F | Resp 20 | Ht 68.0 in | Wt 171.6 lb

## 2011-11-20 DIAGNOSIS — R161 Splenomegaly, not elsewhere classified: Secondary | ICD-10-CM

## 2011-11-20 DIAGNOSIS — D72819 Decreased white blood cell count, unspecified: Secondary | ICD-10-CM

## 2011-11-20 LAB — CBC WITH DIFFERENTIAL/PLATELET
EOS%: 4.7 % (ref 0.0–7.0)
Eosinophils Absolute: 0.1 10*3/uL (ref 0.0–0.5)
LYMPH%: 43.7 % (ref 14.0–49.0)
MCH: 28.8 pg (ref 27.2–33.4)
MCHC: 33.9 g/dL (ref 32.0–36.0)
MCV: 84.8 fL (ref 79.3–98.0)
MONO%: 13.8 % (ref 0.0–14.0)
NEUT#: 1 10*3/uL — ABNORMAL LOW (ref 1.5–6.5)
Platelets: 187 10*3/uL (ref 140–400)
RBC: 4.38 10*6/uL (ref 4.20–5.82)

## 2011-11-20 NOTE — Telephone Encounter (Signed)
gve the pt his march 2014 appt calendar °

## 2011-11-20 NOTE — Progress Notes (Signed)
[name], male   DOB: 1950-12-28, 61 y.o.   MRN: [id number] CSN: [id number]  CC: Hector Pounds, MD  Hector Gomez, M.D.  Identifying Statement: Hector Gomez is a 61 y.o. male with progressive leukopenia who presents for follow up of bone marrow biopsy results   Interval History: Hector Gomez is a 61 year-old Caucasian man with a history of progressive leukopenia.  Hector Gomez was last seen in our office on 10/28/2011.   Dr. Dalene Gomez performed a bone marrow biopsy on 11/06/2011.  Hector results of Hector bone marrow biopsy are unremarkable and this was explained to Hector Gomez.  Hector Gomez was without any complaints today and denies any symptomatology.  Hector Gomez specifically denies fever, chills, unusual bleeding, night sweats, N/V/D and constipation.   Medications: Current Outpatient Prescriptions  Medication Sig Dispense Refill  . ALPRAZolam (XANAX) 0.5 MG tablet Take 0.5 mg by mouth as needed.      . clonazePAM (KLONOPIN) 0.5 MG tablet Take 0.5 mg by mouth at bedtime.      . fenofibrate (TRICOR) 145 MG tablet Take 145 mg by mouth daily.      . Omega-3 Fatty Acids (FISH OIL PO) Take 1,400 mg by mouth daily.      . risperiDONE (RISPERDAL) 0.5 MG tablet Take 0.5 mg by mouth at bedtime.      . rosuvastatin (CRESTOR) 10 MG tablet Take 10 mg by mouth 2 (two) times a week. Monday and Friday      . TRAZODONE HCL PO Take 1 tablet by mouth as needed.      . zolpidem (AMBIEN CR) 12.5 MG CR tablet Take 12.5 mg by mouth at bedtime.        Allergies: Allergies  Allergen Reactions  . Adhesive (Tape) Rash    Past Medical History: Past Medical History  Diagnosis Date  . Anxiety   . Hyperlipemia   . High triglycerides      Family History: Family History  Problem Relation Age of Onset  . Cancer Father     Social History: History  Substance Use Topics  . Smoking status: Former Smoker -- 0.0 packs/day for 5 years    Types: Cigarettes  . Smokeless tobacco: Never Used  .  Alcohol Use: 1.2 oz/week    1 Glasses of wine, 1 Cans of beer per week    Review of Systems: 10 point review of systems was completed and is negative except as noted above.   Physical Exam: There were no vitals taken for this visit.  General appearance: Alert, cooperative, well nourished, no apparent distress Head: Normocephalic, without obvious abnormality, atraumatic Eyes: Conjunctivae/corneas clear, PERRLA, EOMI Nose: Nares, septum and mucosa are normal, no drainage or sinus tenderness. Neck: No adenopathy, supple, symmetrical, trachea midline, thyroid not enlarged, no tenderness Resp: Clear to auscultation bilaterally GI: Soft, non-tender;=, normoactive bowel sounds, splenomegaly Extremities: Extremities normal, atraumatic, no cyanosis or edema Lymph nodes: Cervical, supraclavicular, and axillary nodes normal Neurologic: Grossly normal   Laboratory Data: Results for orders placed in visit on 11/20/11 (from Hector past 48 hour(s))  CBC WITH DIFFERENTIAL     Status: Abnormal   Collection Time   11/20/11  2:36 PM      Component Value Range Comment   WBC 2.7 (*) 4.0 - 10.3 10e3/uL    NEUT# 1.0 (*) 1.5 - 6.5 10e3/uL    HGB 12.6 (*) 13.0 - 17.1 g/dL    HCT 78.2 (*) 95.6 - 49.9 %  Platelets 187  140 - 400 10e3/uL    MCV 84.8  79.3 - 98.0 fL    MCH 28.8  27.2 - 33.4 pg    MCHC 33.9  32.0 - 36.0 g/dL    RBC 8.11  9.14 - 7.82 10e6/uL    RDW 13.9  11.0 - 14.6 %    lymph# 1.2  0.9 - 3.3 10e3/uL    MONO# 0.4  0.1 - 0.9 10e3/uL    Eosinophils Absolute 0.1  0.0 - 0.5 10e3/uL    Basophils Absolute 0.0  0.0 - 0.1 10e3/uL    NEUT% 36.0 (*) 39.0 - 75.0 %    LYMPH% 43.7  14.0 - 49.0 %    MONO% 13.8  0.0 - 14.0 %    EOS% 4.7  0.0 - 7.0 %    BASO% 1.8  0.0 - 2.0 %     Impression/Plan: Hector Gomez is a 61 year-old Caucasian man with a history of progressive leukopenia. Because Hector bone marrow and flow cytometry reports were unremarkable, Dr. Dalene Gomez would like Hector Gomez to follow up  with his PCP, Dr. Timothy Gomez for a possible rheumatology referral.  Hector Gomez's abnormally elevated p-ANCA, splenomegaly and history of AVN (please refer to Dr. Lonell Gomez office note from 10/28/2011) leads Dr. Dalene Gomez to believe Hector Gomez may have a connective tissue disorder.  Hector Gomez states that he has an appointment with Dr. Evelene Gomez, Psychiatrist soon and will discuss his medications of Risperdal and Klonopin as these medications may be contributing to Hector leukopenia.  We will see Hector Gomez again in 6 months' time for a follow up office visit and laboratories.  Hector Gomez will contact us in Hector interim if he has any questions or concerns.   Hector Bras, NP-C 11/20/2011, 3:19 PM

## 2011-11-20 NOTE — Patient Instructions (Signed)
Keep up great hand hygiene and avoid infection

## 2011-11-24 ENCOUNTER — Telehealth: Payer: Self-pay | Admitting: Hematology and Oncology

## 2011-11-24 NOTE — Telephone Encounter (Signed)
S/w kristin from [facility] medicine regarding the pt needing a rheumatologist appt. Per kristin we need to fill out the referral form and fax it back to them and they will call the pt with an appt. Filled out the referral form and give it to medical records for them to send the required documents for the appt

## 2011-11-25 ENCOUNTER — Telehealth: Payer: Self-pay | Admitting: Hematology and Oncology

## 2011-11-25 NOTE — Telephone Encounter (Signed)
Faxed records to Dr Zenovia Jordan office.

## 2011-12-03 ENCOUNTER — Other Ambulatory Visit: Payer: Self-pay | Admitting: *Deleted

## 2012-04-02 ENCOUNTER — Telehealth: Payer: Self-pay | Admitting: Oncology

## 2012-04-02 ENCOUNTER — Encounter: Payer: Self-pay | Admitting: Oncology

## 2012-04-02 NOTE — Telephone Encounter (Signed)
Talked to patient gave him appt with Dr. Alveda Reasons pt of Dr.Odogwu

## 2012-04-12 ENCOUNTER — Ambulatory Visit: Payer: 59 | Admitting: Oncology

## 2012-05-11 ENCOUNTER — Telehealth: Payer: Self-pay | Admitting: Oncology

## 2012-05-11 NOTE — Telephone Encounter (Signed)
LVM FOR PT REGARDING TO R/S APPT.Marland KitchenMarland KitchenMarland Kitchen

## 2012-05-11 NOTE — Telephone Encounter (Signed)
PT RETURNED CALL AND WANTS TO CANCEL APPT HAS ANOTHER DR...DR. Judith Blonder

## 2012-05-18 ENCOUNTER — Ambulatory Visit: Payer: 59 | Admitting: Physician Assistant

## 2012-05-18 ENCOUNTER — Other Ambulatory Visit: Payer: 59 | Admitting: Lab

## 2012-05-20 ENCOUNTER — Other Ambulatory Visit: Payer: 59 | Admitting: Lab

## 2012-05-20 ENCOUNTER — Ambulatory Visit: Payer: 59 | Admitting: Hematology and Oncology

## 2012-05-23 ENCOUNTER — Ambulatory Visit: Payer: 59 | Admitting: Oncology

## 2012-05-23 ENCOUNTER — Other Ambulatory Visit: Payer: 59 | Admitting: Lab

## 2012-08-19 ENCOUNTER — Other Ambulatory Visit: Payer: Self-pay | Admitting: Internal Medicine

## 2012-08-23 ENCOUNTER — Other Ambulatory Visit: Payer: 59

## 2012-08-23 ENCOUNTER — Ambulatory Visit
Admission: RE | Admit: 2012-08-23 | Discharge: 2012-08-23 | Disposition: A | Discharge status: Home / Self Care | Payer: 59 | Source: Ambulatory Visit | Attending: Internal Medicine | Admitting: Internal Medicine

## 2012-10-16 ENCOUNTER — Encounter (HOSPITAL_COMMUNITY): Payer: Self-pay | Admitting: Emergency Medicine

## 2012-10-16 ENCOUNTER — Emergency Department (HOSPITAL_COMMUNITY)
Admission: EM | Admit: 2012-10-16 | Discharge: 2012-10-16 | Disposition: A | Discharge status: Home / Self Care | Payer: 59 | Attending: Emergency Medicine | Admitting: Emergency Medicine

## 2012-10-16 DIAGNOSIS — R112 Nausea with vomiting, unspecified: Secondary | ICD-10-CM | POA: Insufficient documentation

## 2012-10-16 DIAGNOSIS — Z87891 Personal history of nicotine dependence: Secondary | ICD-10-CM | POA: Insufficient documentation

## 2012-10-16 DIAGNOSIS — E785 Hyperlipidemia, unspecified: Secondary | ICD-10-CM | POA: Insufficient documentation

## 2012-10-16 DIAGNOSIS — E781 Pure hyperglyceridemia: Secondary | ICD-10-CM | POA: Insufficient documentation

## 2012-10-16 DIAGNOSIS — F411 Generalized anxiety disorder: Secondary | ICD-10-CM | POA: Insufficient documentation

## 2012-10-16 DIAGNOSIS — Z91040 Latex allergy status: Secondary | ICD-10-CM | POA: Insufficient documentation

## 2012-10-16 DIAGNOSIS — Z79899 Other long term (current) drug therapy: Secondary | ICD-10-CM | POA: Insufficient documentation

## 2012-10-16 MED ORDER — ONDANSETRON HCL 4 MG PO TABS: 4.0000 mg | ORAL_TABLET | Freq: Four times a day (QID) | ORAL | Status: DC

## 2012-10-16 MED ORDER — GLUCAGON HCL (RDNA) 1 MG IJ SOLR
0.5000 mg | Freq: Once | INTRAMUSCULAR | Status: AC
  Administered 2012-10-16: 0.5 mg via INTRAVENOUS

## 2012-10-16 MED ORDER — ONDANSETRON 8 MG PO TBDP: 8.0000 mg | ORAL_TABLET | Freq: Once | ORAL | Status: DC

## 2012-10-16 MED ORDER — GLUCAGON HCL (RDNA) 1 MG IJ SOLR
1.0000 mg | Freq: Once | INTRAMUSCULAR | Status: DC
  Filled 2012-10-16: qty 1

## 2012-10-16 MED ORDER — SODIUM CHLORIDE 0.9 % IV BOLUS (SEPSIS): 1000.0000 mL | Freq: Once | INTRAVENOUS | Status: DC

## 2012-10-16 MED ORDER — ONDANSETRON HCL 4 MG/2ML IJ SOLN
4.0000 mg | Freq: Once | INTRAMUSCULAR | Status: AC
  Administered 2012-10-16: 4 mg via INTRAVENOUS
  Filled 2012-10-16: qty 2
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
## 2012-10-16 NOTE — ED Notes (Signed)
Pt alert, arrives from home, c/o nausea after eating at a "cook out", resp even unlabored, skin pwd

## 2012-10-19 NOTE — ED Provider Notes (Signed)
Medical screening examination/treatment/procedure(s) were performed by non-physician practitioner and as supervising physician I was immediately available for consultation/collaboration.   Richardean Canal, MD 10/19/12 [phone]

## 2012-12-23 ENCOUNTER — Ambulatory Visit (INDEPENDENT_AMBULATORY_CARE_PROVIDER_SITE_OTHER): Payer: 59 | Admitting: Emergency Medicine

## 2012-12-23 VITALS — BP 130/80 | HR 92 | Temp 98.0°F | Resp 16 | Ht 71.0 in | Wt 180.0 lb

## 2012-12-23 DIAGNOSIS — L0390 Cellulitis, unspecified: Secondary | ICD-10-CM

## 2012-12-23 DIAGNOSIS — L0291 Cutaneous abscess, unspecified: Secondary | ICD-10-CM

## 2012-12-23 MED ORDER — DOXYCYCLINE HYCLATE 100 MG PO CAPS: 100.0000 mg | ORAL_CAPSULE | Freq: Two times a day (BID) | ORAL | Status: DC

## 2012-12-23 NOTE — Progress Notes (Signed)
  Subjective:    Patient ID: Hector Gomez, male    DOB: [birth year], 62 y.o.   MRN: [id number]  HPI 62 year old male presents for evaluation of a possible spider bite on his left medial thigh.  States yesterday he noticed a pain that he though was an insect bite of some sort.  Did not look to see what the area looked like and did not see the insect.  This morning he woke up and the area was red and had a central nodule. Denies drainage, warmth, fever, chills, nausea, vomiting, headache, or abdominal pain.  No hx of skin infections or MRSA.   Patient is otherwise healthy with no other concerns today.     Review of Systems  Constitutional: Negative for fever and chills.  Gastrointestinal: Negative for nausea, vomiting and abdominal pain.  Skin: Positive for color change and wound.  Neurological: Negative for dizziness and headaches.       Objective:   Physical Exam  Constitutional: He is oriented to person, place, and time. He appears well-developed and well-nourished.  HENT:  Head: Normocephalic and atraumatic.  Right Ear: External ear normal.  Left Ear: External ear normal.  Eyes: Conjunctivae are normal.  Neck: Normal range of motion.  Cardiovascular: Normal rate.   Pulmonary/Chest: Effort normal.  Neurological: He is alert and oriented to person, place, and time.  Skin:     Psychiatric: He has a normal mood and affect. His behavior is normal. Judgment and thought content normal.    Pustule opened with 25G needle. Moderate amount of purulence expressed.culture collected Band-aid applied.       Assessment & Plan:  Cellulitis - Plan: doxycycline (VIBRAMYCIN) 100 MG capsule, Wound culture  Wound culture pending Start doxycycline 100 mg bid x 10 days Wash with soap and water daily. Keep covered until healed RTC or go to ER if symptoms worsen or if erythema is spreading, fever develop, or abscess forms

## 2012-12-26 LAB — WOUND CULTURE
Gram Stain: NONE SEEN
Gram Stain: NONE SEEN

## 2012-12-29 ENCOUNTER — Telehealth: Payer: Self-pay

## 2012-12-29 NOTE — Telephone Encounter (Signed)
Pt called about labs. Notified.  

## 2013-07-19 ENCOUNTER — Emergency Department (HOSPITAL_COMMUNITY): Payer: 59

## 2013-07-19 ENCOUNTER — Observation Stay (HOSPITAL_COMMUNITY)
Admission: EM | Admit: 2013-07-19 | Discharge: 2013-07-20 | Disposition: A | Discharge status: Home / Self Care | Payer: 59 | Attending: General Surgery | Admitting: General Surgery

## 2013-07-19 ENCOUNTER — Encounter (HOSPITAL_COMMUNITY): Payer: Self-pay | Admitting: Emergency Medicine

## 2013-07-19 ENCOUNTER — Observation Stay (HOSPITAL_COMMUNITY): Payer: 59

## 2013-07-19 DIAGNOSIS — S0190XA Unspecified open wound of unspecified part of head, initial encounter: Secondary | ICD-10-CM

## 2013-07-19 DIAGNOSIS — E785 Hyperlipidemia, unspecified: Secondary | ICD-10-CM | POA: Insufficient documentation

## 2013-07-19 DIAGNOSIS — S066XAA Traumatic subarachnoid hemorrhage with loss of consciousness status unknown, initial encounter: Secondary | ICD-10-CM

## 2013-07-19 DIAGNOSIS — D72819 Decreased white blood cell count, unspecified: Secondary | ICD-10-CM | POA: Insufficient documentation

## 2013-07-19 DIAGNOSIS — I609 Nontraumatic subarachnoid hemorrhage, unspecified: Secondary | ICD-10-CM

## 2013-07-19 DIAGNOSIS — S069XAA Unspecified intracranial injury with loss of consciousness status unknown, initial encounter: Secondary | ICD-10-CM | POA: Diagnosis present

## 2013-07-19 DIAGNOSIS — N189 Chronic kidney disease, unspecified: Secondary | ICD-10-CM | POA: Insufficient documentation

## 2013-07-19 DIAGNOSIS — R161 Splenomegaly, not elsewhere classified: Secondary | ICD-10-CM | POA: Insufficient documentation

## 2013-07-19 DIAGNOSIS — Z87891 Personal history of nicotine dependence: Secondary | ICD-10-CM | POA: Insufficient documentation

## 2013-07-19 DIAGNOSIS — S066X9A Traumatic subarachnoid hemorrhage with loss of consciousness of unspecified duration, initial encounter: Secondary | ICD-10-CM

## 2013-07-19 DIAGNOSIS — IMO0002 Reserved for concepts with insufficient information to code with codable children: Secondary | ICD-10-CM | POA: Insufficient documentation

## 2013-07-19 DIAGNOSIS — Z96649 Presence of unspecified artificial hip joint: Secondary | ICD-10-CM | POA: Insufficient documentation

## 2013-07-19 DIAGNOSIS — S0180XA Unspecified open wound of other part of head, initial encounter: Secondary | ICD-10-CM | POA: Insufficient documentation

## 2013-07-19 DIAGNOSIS — S069X9A Unspecified intracranial injury with loss of consciousness of unspecified duration, initial encounter: Secondary | ICD-10-CM | POA: Diagnosis present

## 2013-07-19 DIAGNOSIS — S301XXA Contusion of abdominal wall, initial encounter: Secondary | ICD-10-CM | POA: Diagnosis present

## 2013-07-19 DIAGNOSIS — F319 Bipolar disorder, unspecified: Secondary | ICD-10-CM | POA: Insufficient documentation

## 2013-07-19 DIAGNOSIS — T07XXXA Unspecified multiple injuries, initial encounter: Secondary | ICD-10-CM | POA: Diagnosis present

## 2013-07-19 DIAGNOSIS — D62 Acute posthemorrhagic anemia: Secondary | ICD-10-CM | POA: Diagnosis not present

## 2013-07-19 DIAGNOSIS — S066X0A Traumatic subarachnoid hemorrhage without loss of consciousness, initial encounter: Principal | ICD-10-CM | POA: Insufficient documentation

## 2013-07-19 HISTORY — DX: Unspecified intracranial injury with loss of consciousness status unknown, initial encounter: S06.9XAA

## 2013-07-19 HISTORY — DX: Bipolar disorder, unspecified: F31.9

## 2013-07-19 HISTORY — DX: Unspecified intracranial injury with loss of consciousness of unspecified duration, initial encounter: S06.9X9A

## 2013-07-19 LAB — CBC WITH DIFFERENTIAL/PLATELET
BASOS ABS: 0 10*3/uL (ref 0.0–0.1)
Basophils Relative: 1 % (ref 0–1)
EOS ABS: 0.1 10*3/uL (ref 0.0–0.7)
EOS PCT: 2 % (ref 0–5)
HCT: 38.5 % — ABNORMAL LOW (ref 39.0–52.0)
Hemoglobin: 13.1 g/dL (ref 13.0–17.0)
LYMPHS ABS: 0.6 10*3/uL — AB (ref 0.7–4.0)
Lymphocytes Relative: 14 % (ref 12–46)
MCH: 28.8 pg (ref 26.0–34.0)
MCHC: 34 g/dL (ref 30.0–36.0)
MCV: 84.6 fL (ref 78.0–100.0)
Monocytes Absolute: 0.6 10*3/uL (ref 0.1–1.0)
Monocytes Relative: 15 % — ABNORMAL HIGH (ref 3–12)
NEUTROS PCT: 68 % (ref 43–77)
Neutro Abs: 2.9 10*3/uL (ref 1.7–7.7)
PLATELETS: 153 10*3/uL (ref 150–400)
RBC: 4.55 MIL/uL (ref 4.22–5.81)
RDW: 13.4 % (ref 11.5–15.5)
WBC: 4.2 10*3/uL (ref 4.0–10.5)

## 2013-07-19 LAB — URINALYSIS, ROUTINE W REFLEX MICROSCOPIC
Glucose, UA: NEGATIVE mg/dL
Ketones, ur: 15 mg/dL — AB
NITRITE: NEGATIVE
PROTEIN: 100 mg/dL — AB
SPECIFIC GRAVITY, URINE: 1.026 (ref 1.005–1.030)
UROBILINOGEN UA: 1 mg/dL (ref 0.0–1.0)
pH: 5 (ref 5.0–8.0)

## 2013-07-19 LAB — BASIC METABOLIC PANEL
BUN: 13 mg/dL (ref 6–23)
CALCIUM: 9.2 mg/dL (ref 8.4–10.5)
CO2: 25 mEq/L (ref 19–32)
Chloride: 106 mEq/L (ref 96–112)
Creatinine, Ser: 1.6 mg/dL — ABNORMAL HIGH (ref 0.50–1.35)
GFR, EST AFRICAN AMERICAN: 51 mL/min — AB (ref >90)
GFR, EST NON AFRICAN AMERICAN: 44 mL/min — AB (ref >90)
GLUCOSE: 111 mg/dL — AB (ref 70–99)
POTASSIUM: 4.1 meq/L (ref 3.7–5.3)
SODIUM: 140 meq/L (ref 137–147)

## 2013-07-19 LAB — URINE MICROSCOPIC-ADD ON

## 2013-07-19 LAB — TROPONIN I: Troponin I: <0.3 ng/mL (ref <0.30)

## 2013-07-19 MED ORDER — LAMOTRIGINE 150 MG PO TABS
150.0000 mg | ORAL_TABLET | Freq: Every day | ORAL | Status: DC
  Administered 2013-07-19 – 2013-07-20 (×2): 150 mg via ORAL
  Filled 2013-07-19 (×2): qty 1

## 2013-07-19 MED ORDER — ONDANSETRON HCL 4 MG/2ML IJ SOLN
4.0000 mg | Freq: Four times a day (QID) | INTRAMUSCULAR | Status: DC | PRN
  Administered 2013-07-19: 4 mg via INTRAVENOUS
  Filled 2013-07-19: qty 2

## 2013-07-19 MED ORDER — ONDANSETRON HCL 4 MG PO TABS: 4.0000 mg | ORAL_TABLET | Freq: Four times a day (QID) | ORAL | Status: DC | PRN

## 2013-07-19 MED ORDER — HYDROCODONE-ACETAMINOPHEN 5-325 MG PO TABS
1.0000 | ORAL_TABLET | ORAL | Status: DC | PRN
  Administered 2013-07-19 – 2013-07-20 (×4): 1 via ORAL
  Filled 2013-07-19 (×4): qty 1

## 2013-07-19 MED ORDER — SODIUM CHLORIDE 0.9 % IV SOLN
Freq: Once | INTRAVENOUS | Status: AC
  Administered 2013-07-19: 11:00:00 via INTRAVENOUS

## 2013-07-19 MED ORDER — MORPHINE SULFATE 2 MG/ML IJ SOLN
2.0000 mg | INTRAMUSCULAR | Status: DC | PRN
Start: 2013-07-19 — End: 2013-07-20
  Administered 2013-07-19 – 2013-07-20 (×7): 2 mg via INTRAVENOUS
  Filled 2013-07-19 (×7): qty 1

## 2013-07-19 MED ORDER — ACETAMINOPHEN 325 MG PO TABS: 650.0000 mg | ORAL_TABLET | ORAL | Status: DC | PRN

## 2013-07-19 MED ORDER — RISPERIDONE 0.5 MG PO TABS
0.5000 mg | ORAL_TABLET | Freq: Every day | ORAL | Status: DC
  Administered 2013-07-19: 0.5 mg via ORAL
  Filled 2013-07-19 (×2): qty 1

## 2013-07-19 MED ORDER — SODIUM CHLORIDE 0.9 % IV SOLN
INTRAVENOUS | Status: DC
  Administered 2013-07-19: 21:00:00 via INTRAVENOUS

## 2013-07-19 MED ORDER — ZOLPIDEM TARTRATE 5 MG PO TABS
5.0000 mg | ORAL_TABLET | Freq: Every day | ORAL | Status: DC
  Administered 2013-07-19: 5 mg via ORAL
  Filled 2013-07-19: qty 1

## 2013-07-19 NOTE — Progress Notes (Signed)
Pt returned back from radiology. No sign or symptoms of acute distress. Will continue to monitor [facility], RN, BSN 07/19/2013 10:27 PM

## 2013-07-19 NOTE — Progress Notes (Signed)
Pt transported down to radiology via pt transportation. Pt alert and oriented upon exit from unit. Fortino Sic, RN, BSN 07/19/2013 10:06 PM

## 2013-07-19 NOTE — H&P (Signed)
Will admit the patient for observation.  This patient has been seen and I agree with the findings and treatment plan.  Kathryne Eriksson. Dahlia Bailiff, MD, [place] [phone] (pager) [phone] (direct pager) Trauma Surgeon
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
## 2013-07-19 NOTE — ED Notes (Signed)
Patient being taken up to room by [facility], [place].
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
## 2013-07-19 NOTE — ED Notes (Signed)
To xray via str  

## 2013-07-20 DIAGNOSIS — S301XXA Contusion of abdominal wall, initial encounter: Secondary | ICD-10-CM | POA: Diagnosis present

## 2013-07-20 DIAGNOSIS — T07XXXA Unspecified multiple injuries, initial encounter: Secondary | ICD-10-CM | POA: Diagnosis present

## 2013-07-20 DIAGNOSIS — D62 Acute posthemorrhagic anemia: Secondary | ICD-10-CM | POA: Diagnosis not present

## 2013-07-20 HISTORY — DX: Contusion of abdominal wall, initial encounter: S30.1XXA

## 2013-07-20 LAB — CBC
HCT: 35.8 % — ABNORMAL LOW (ref 39.0–52.0)
Hemoglobin: 12 g/dL — ABNORMAL LOW (ref 13.0–17.0)
MCH: 28.8 pg (ref 26.0–34.0)
MCHC: 33.5 g/dL (ref 30.0–36.0)
MCV: 85.9 fL (ref 78.0–100.0)
PLATELETS: 146 10*3/uL — AB (ref 150–400)
RBC: 4.17 MIL/uL — AB (ref 4.22–5.81)
RDW: 13.5 % (ref 11.5–15.5)
WBC: 4 10*3/uL (ref 4.0–10.5)

## 2013-07-20 MED ORDER — MORPHINE SULFATE 2 MG/ML IJ SOLN: 2.0000 mg | INTRAMUSCULAR | Status: DC | PRN

## 2013-07-20 MED ORDER — HYDROCODONE-ACETAMINOPHEN 10-325 MG PO TABS
0.5000 | ORAL_TABLET | ORAL | Status: DC | PRN
  Administered 2013-07-20: 1 via ORAL
  Filled 2013-07-20: qty 1

## 2013-07-20 MED ORDER — HYDROCODONE-ACETAMINOPHEN 5-325 MG PO TABS: 1.0000 | ORAL_TABLET | ORAL | Status: DC | PRN
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
## 2013-07-20 NOTE — Discharge Planning (Signed)
VSS. Discharge orders received. Patient given verbal and written education about [facility] and follow up information. Patient also given prescription for norco. Education completed. Discharged home with friend.
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
## 2013-07-20 NOTE — Progress Notes (Signed)
Okay to go home.  This patient has been seen and I agree with the findings and treatment plan.  Kathryne Eriksson. Dahlia Bailiff, MD, [place] [phone] (pager) [phone] (direct pager) Trauma Surgeon

## 2013-07-20 NOTE — Discharge Instructions (Signed)
No driving while taking hydrocodone. ° °Wash wounds daily in shower with soap and water. °Do not soak. °Apply antibiotic ointment (e.g. Neosporin) twice daily and as needed to keep moist. °Cover with dry dressing. ° °

## 2013-07-20 NOTE — Discharge Summary (Signed)
Okay to go home.  This patient has been seen and I agree with the findings and treatment plan.  Kathryne Eriksson. Dahlia Bailiff, MD, [place] [phone] (pager) [phone] (direct pager) Trauma Surgeon
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
## 2014-01-22 ENCOUNTER — Telehealth: Payer: Self-pay | Admitting: Hematology

## 2014-01-22 NOTE — Telephone Encounter (Signed)
Lt mess for pt for future appt. 01/30/14 at 2:30pm

## 2014-01-23 ENCOUNTER — Telehealth: Payer: Self-pay | Admitting: Hematology

## 2014-01-23 NOTE — Telephone Encounter (Signed)
C/D ON 11/24 FOR NP APPT ON 12/01

## 2014-01-30 ENCOUNTER — Telehealth: Payer: Self-pay | Admitting: Hematology

## 2014-01-30 ENCOUNTER — Encounter: Payer: Self-pay | Admitting: Hematology

## 2014-01-30 ENCOUNTER — Ambulatory Visit (HOSPITAL_BASED_OUTPATIENT_CLINIC_OR_DEPARTMENT_OTHER): Payer: 59 | Admitting: Hematology

## 2014-01-30 ENCOUNTER — Ambulatory Visit (HOSPITAL_BASED_OUTPATIENT_CLINIC_OR_DEPARTMENT_OTHER): Payer: 59

## 2014-01-30 VITALS — BP 129/86 | HR 93 | Temp 97.7°F | Resp 18 | Ht 69.0 in | Wt 172.0 lb

## 2014-01-30 DIAGNOSIS — D72819 Decreased white blood cell count, unspecified: Secondary | ICD-10-CM

## 2014-01-30 LAB — COMPREHENSIVE METABOLIC PANEL (CC13)
ALBUMIN: 3.8 g/dL (ref 3.5–5.0)
ALK PHOS: 86 U/L (ref 40–150)
ALT: 13 U/L (ref 0–55)
ANION GAP: 9 meq/L (ref 3–11)
AST: 17 U/L (ref 5–34)
BUN: 16 mg/dL (ref 7.0–26.0)
CALCIUM: 9.8 mg/dL (ref 8.4–10.4)
CO2: 26 mEq/L (ref 22–29)
Chloride: 109 mEq/L (ref 98–109)
Creatinine: 2 mg/dL — ABNORMAL HIGH (ref 0.7–1.3)
Glucose: 100 mg/dl (ref 70–140)
Potassium: 4.4 mEq/L (ref 3.5–5.1)
Sodium: 143 mEq/L (ref 136–145)
Total Bilirubin: 0.47 mg/dL (ref 0.20–1.20)
Total Protein: 7.4 g/dL (ref 6.4–8.3)

## 2014-01-30 LAB — CBC WITH DIFFERENTIAL/PLATELET
BASO%: 1.6 % (ref 0.0–2.0)
Basophils Absolute: 0 10*3/uL (ref 0.0–0.1)
EOS%: 6.3 % (ref 0.0–7.0)
Eosinophils Absolute: 0.1 10*3/uL (ref 0.0–0.5)
HEMATOCRIT: 38.6 % (ref 38.4–49.9)
HGB: 12.5 g/dL — ABNORMAL LOW (ref 13.0–17.1)
LYMPH%: 36.8 % (ref 14.0–49.0)
MCH: 27.4 pg (ref 27.2–33.4)
MCHC: 32.4 g/dL (ref 32.0–36.0)
MCV: 84.5 fL (ref 79.3–98.0)
MONO#: 0.4 10*3/uL (ref 0.1–0.9)
MONO%: 17.5 % — AB (ref 0.0–14.0)
NEUT#: 0.9 10*3/uL — ABNORMAL LOW (ref 1.5–6.5)
NEUT%: 37.8 % — ABNORMAL LOW (ref 39.0–75.0)
Platelets: 181 10*3/uL (ref 140–400)
RBC: 4.56 10*6/uL (ref 4.20–5.82)
RDW: 14.2 % (ref 11.0–14.6)
WBC: 2.3 10*3/uL — AB (ref 4.0–10.3)
lymph#: 0.8 10*3/uL — ABNORMAL LOW (ref 0.9–3.3)

## 2014-01-30 NOTE — Progress Notes (Signed)
Checked in new patient with no issues. He has appt card and has not traveled.

## 2014-01-30 NOTE — Telephone Encounter (Signed)
gv adn printed appt sched and avs for pt for March...sent pt to lab °
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
## 2014-03-01 ENCOUNTER — Ambulatory Visit: Payer: 59

## 2014-03-01 ENCOUNTER — Ambulatory Visit: Payer: 59 | Admitting: Hematology

## 2014-05-01 ENCOUNTER — Ambulatory Visit (HOSPITAL_BASED_OUTPATIENT_CLINIC_OR_DEPARTMENT_OTHER): Payer: 59 | Admitting: Hematology

## 2014-05-01 ENCOUNTER — Other Ambulatory Visit (HOSPITAL_BASED_OUTPATIENT_CLINIC_OR_DEPARTMENT_OTHER): Payer: 59

## 2014-05-01 ENCOUNTER — Encounter: Payer: Self-pay | Admitting: Hematology

## 2014-05-01 ENCOUNTER — Telehealth: Payer: Self-pay | Admitting: Hematology

## 2014-05-01 VITALS — BP 137/68 | HR 90 | Temp 98.2°F | Resp 18 | Ht 69.0 in | Wt 175.4 lb

## 2014-05-01 DIAGNOSIS — D709 Neutropenia, unspecified: Secondary | ICD-10-CM

## 2014-05-01 DIAGNOSIS — N189 Chronic kidney disease, unspecified: Secondary | ICD-10-CM

## 2014-05-01 DIAGNOSIS — I2510 Atherosclerotic heart disease of native coronary artery without angina pectoris: Secondary | ICD-10-CM

## 2014-05-01 DIAGNOSIS — D72819 Decreased white blood cell count, unspecified: Secondary | ICD-10-CM

## 2014-05-01 DIAGNOSIS — D649 Anemia, unspecified: Secondary | ICD-10-CM

## 2014-05-01 DIAGNOSIS — F419 Anxiety disorder, unspecified: Secondary | ICD-10-CM

## 2014-05-01 LAB — COMPREHENSIVE METABOLIC PANEL (CC13)
ALBUMIN: 3.6 g/dL (ref 3.5–5.0)
ALK PHOS: 91 U/L (ref 40–150)
ALT: 11 U/L (ref 0–55)
AST: 19 U/L (ref 5–34)
Anion Gap: 11 mEq/L (ref 3–11)
BUN: 13.5 mg/dL (ref 7.0–26.0)
CHLORIDE: 108 meq/L (ref 98–109)
CO2: 23 mEq/L (ref 22–29)
Calcium: 9.3 mg/dL (ref 8.4–10.4)
Creatinine: 1.7 mg/dL — ABNORMAL HIGH (ref 0.7–1.3)
EGFR: 42 mL/min/{1.73_m2} — ABNORMAL LOW (ref >90)
GLUCOSE: 122 mg/dL (ref 70–140)
Potassium: 3.9 mEq/L (ref 3.5–5.1)
Sodium: 143 mEq/L (ref 136–145)
TOTAL PROTEIN: 6.9 g/dL (ref 6.4–8.3)
Total Bilirubin: 0.36 mg/dL (ref 0.20–1.20)

## 2014-05-01 LAB — CBC WITH DIFFERENTIAL/PLATELET
BASO%: 1.2 % (ref 0.0–2.0)
BASOS ABS: 0 10*3/uL (ref 0.0–0.1)
EOS%: 8.5 % — AB (ref 0.0–7.0)
Eosinophils Absolute: 0.2 10*3/uL (ref 0.0–0.5)
HCT: 37.1 % — ABNORMAL LOW (ref 38.4–49.9)
HGB: 12.2 g/dL — ABNORMAL LOW (ref 13.0–17.1)
LYMPH%: 36.4 % (ref 14.0–49.0)
MCH: 27.5 pg (ref 27.2–33.4)
MCHC: 32.9 g/dL (ref 32.0–36.0)
MCV: 83.8 fL (ref 79.3–98.0)
MONO#: 0.4 10*3/uL (ref 0.1–0.9)
MONO%: 16.8 % — AB (ref 0.0–14.0)
NEUT#: 1 10*3/uL — ABNORMAL LOW (ref 1.5–6.5)
NEUT%: 37.1 % — AB (ref 39.0–75.0)
PLATELETS: 198 10*3/uL (ref 140–400)
RBC: 4.42 10*6/uL (ref 4.20–5.82)
RDW: 13.4 % (ref 11.0–14.6)
WBC: 2.6 10*3/uL — ABNORMAL LOW (ref 4.0–10.3)
lymph#: 0.9 10*3/uL (ref 0.9–3.3)

## 2014-05-01 NOTE — Telephone Encounter (Signed)
gv and printed appt schedand avs for pt for Sept °
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
## 2014-09-28 DIAGNOSIS — N183 Chronic kidney disease, stage 3 (moderate): Secondary | ICD-10-CM | POA: Diagnosis not present

## 2014-09-28 DIAGNOSIS — E785 Hyperlipidemia, unspecified: Secondary | ICD-10-CM | POA: Diagnosis not present

## 2014-09-28 DIAGNOSIS — Z125 Encounter for screening for malignant neoplasm of prostate: Secondary | ICD-10-CM | POA: Diagnosis not present

## 2014-10-02 DIAGNOSIS — Z23 Encounter for immunization: Secondary | ICD-10-CM | POA: Diagnosis not present

## 2014-11-13 ENCOUNTER — Telehealth: Payer: Self-pay | Admitting: Hematology

## 2014-11-13 ENCOUNTER — Ambulatory Visit (HOSPITAL_BASED_OUTPATIENT_CLINIC_OR_DEPARTMENT_OTHER): Payer: Medicare Other | Admitting: Hematology

## 2014-11-13 ENCOUNTER — Ambulatory Visit (HOSPITAL_BASED_OUTPATIENT_CLINIC_OR_DEPARTMENT_OTHER): Payer: Medicare Other

## 2014-11-13 ENCOUNTER — Encounter: Payer: Self-pay | Admitting: Hematology

## 2014-11-13 VITALS — BP 140/81 | HR 87 | Temp 98.2°F | Resp 18 | Ht 69.0 in | Wt 176.9 lb

## 2014-11-13 DIAGNOSIS — D72819 Decreased white blood cell count, unspecified: Secondary | ICD-10-CM

## 2014-11-13 DIAGNOSIS — D649 Anemia, unspecified: Secondary | ICD-10-CM | POA: Diagnosis not present

## 2014-11-13 LAB — CBC & DIFF AND RETIC
BASO%: 0.8 % (ref 0.0–2.0)
Basophils Absolute: 0 10*3/uL (ref 0.0–0.1)
EOS%: 11.4 % — AB (ref 0.0–7.0)
Eosinophils Absolute: 0.3 10*3/uL (ref 0.0–0.5)
HCT: 39.7 % (ref 38.4–49.9)
HGB: 13.4 g/dL (ref 13.0–17.1)
Immature Retic Fract: 10.1 % (ref 3.00–10.60)
LYMPH%: 32.9 % (ref 14.0–49.0)
MCH: 28.7 pg (ref 27.2–33.4)
MCHC: 33.8 g/dL (ref 32.0–36.0)
MCV: 85 fL (ref 79.3–98.0)
MONO#: 0.4 10*3/uL (ref 0.1–0.9)
MONO%: 15 % — ABNORMAL HIGH (ref 0.0–14.0)
NEUT%: 39.9 % (ref 39.0–75.0)
NEUTROS ABS: 1 10*3/uL — AB (ref 1.5–6.5)
Platelets: 195 10*3/uL (ref 140–400)
RBC: 4.67 10*6/uL (ref 4.20–5.82)
RDW: 13.8 % (ref 11.0–14.6)
RETIC %: 2.4 % — AB (ref 0.80–1.80)
RETIC CT ABS: 112.08 10*3/uL — AB (ref 34.80–93.90)
WBC: 2.5 10*3/uL — AB (ref 4.0–10.3)
lymph#: 0.8 10*3/uL — ABNORMAL LOW (ref 0.9–3.3)

## 2014-11-13 LAB — COMPREHENSIVE METABOLIC PANEL (CC13)
ALT: 11 U/L (ref 0–55)
ANION GAP: 8 meq/L (ref 3–11)
AST: 16 U/L (ref 5–34)
Albumin: 3.5 g/dL (ref 3.5–5.0)
Alkaline Phosphatase: 95 U/L (ref 40–150)
BUN: 17.9 mg/dL (ref 7.0–26.0)
CO2: 26 meq/L (ref 22–29)
CREATININE: 1.7 mg/dL — AB (ref 0.7–1.3)
Calcium: 9.8 mg/dL (ref 8.4–10.4)
Chloride: 108 mEq/L (ref 98–109)
EGFR: 41 mL/min/{1.73_m2} — ABNORMAL LOW (ref >90)
Glucose: 109 mg/dl (ref 70–140)
POTASSIUM: 4.2 meq/L (ref 3.5–5.1)
Sodium: 142 mEq/L (ref 136–145)
Total Bilirubin: 0.4 mg/dL (ref 0.20–1.20)
Total Protein: 7.4 g/dL (ref 6.4–8.3)

## 2014-11-13 LAB — IRON AND TIBC CHCC
%SAT: 20 % (ref 20–55)
IRON: 57 ug/dL (ref 42–163)
TIBC: 283 ug/dL (ref 202–409)
UIBC: 226 ug/dL (ref 117–376)

## 2014-11-13 LAB — FERRITIN CHCC: Ferritin: 157 ng/ml (ref 22–316)
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
## 2014-11-13 NOTE — Telephone Encounter (Signed)
per pof to sch pt appt-gave pt copy of avs °

## 2014-11-14 DIAGNOSIS — R768 Other specified abnormal immunological findings in serum: Secondary | ICD-10-CM | POA: Diagnosis not present

## 2014-11-14 DIAGNOSIS — E780 Pure hypercholesterolemia: Secondary | ICD-10-CM | POA: Diagnosis not present

## 2014-11-14 DIAGNOSIS — R161 Splenomegaly, not elsewhere classified: Secondary | ICD-10-CM | POA: Diagnosis not present

## 2014-11-14 DIAGNOSIS — N183 Chronic kidney disease, stage 3 (moderate): Secondary | ICD-10-CM | POA: Diagnosis not present

## 2014-11-14 DIAGNOSIS — D72819 Decreased white blood cell count, unspecified: Secondary | ICD-10-CM | POA: Diagnosis not present

## 2014-11-14 DIAGNOSIS — I10 Essential (primary) hypertension: Secondary | ICD-10-CM | POA: Diagnosis not present

## 2014-11-15 LAB — PROTEIN ELECTROPHORESIS, SERUM, WITH REFLEX
Albumin ELP: 3.9 g/dL (ref 3.8–4.8)
Alpha-1-Globulin: 0.4 g/dL — ABNORMAL HIGH (ref 0.2–0.3)
Alpha-2-Globulin: 0.7 g/dL (ref 0.5–0.9)
Beta 2: 0.5 g/dL (ref 0.2–0.5)
Beta Globulin: 0.6 g/dL (ref 0.4–0.6)
Gamma Globulin: 1.2 g/dL (ref 0.8–1.7)
Total Protein, Serum Electrophoresis: 7.2 g/dL (ref 6.1–8.1)

## 2014-11-15 LAB — IGG, IGA, IGM
IGA: 590 mg/dL — AB (ref 68–379)
IgG (Immunoglobin G), Serum: 1350 mg/dL (ref 650–1600)
IgM, Serum: 115 mg/dL (ref 41–251)

## 2014-11-15 LAB — IFE INTERPRETATION

## 2015-03-13 ENCOUNTER — Encounter: Payer: Self-pay | Admitting: Internal Medicine

## 2015-05-01 DIAGNOSIS — R161 Splenomegaly, not elsewhere classified: Secondary | ICD-10-CM | POA: Diagnosis not present

## 2015-05-01 DIAGNOSIS — N183 Chronic kidney disease, stage 3 (moderate): Secondary | ICD-10-CM | POA: Diagnosis not present

## 2015-05-01 DIAGNOSIS — D72819 Decreased white blood cell count, unspecified: Secondary | ICD-10-CM | POA: Diagnosis not present

## 2015-05-01 DIAGNOSIS — R768 Other specified abnormal immunological findings in serum: Secondary | ICD-10-CM | POA: Diagnosis not present

## 2015-05-01 DIAGNOSIS — I10 Essential (primary) hypertension: Secondary | ICD-10-CM | POA: Diagnosis not present

## 2015-05-01 DIAGNOSIS — E7800 Pure hypercholesterolemia, unspecified: Secondary | ICD-10-CM | POA: Diagnosis not present

## 2015-06-03 ENCOUNTER — Other Ambulatory Visit: Payer: Self-pay | Admitting: Hematology

## 2015-06-04 ENCOUNTER — Telehealth: Payer: Self-pay | Admitting: Hematology

## 2015-06-04 NOTE — Telephone Encounter (Signed)
Spoke with patient to confirm r/s appts on 4/10 to 4/12 in the afternoon per MD 4/3 pof

## 2015-06-10 ENCOUNTER — Ambulatory Visit: Payer: Medicare Other | Admitting: Hematology

## 2015-06-10 ENCOUNTER — Other Ambulatory Visit: Payer: Medicare Other

## 2015-06-12 ENCOUNTER — Telehealth: Payer: Self-pay | Admitting: Hematology

## 2015-06-12 ENCOUNTER — Encounter: Payer: Self-pay | Admitting: Hematology

## 2015-06-12 ENCOUNTER — Other Ambulatory Visit (HOSPITAL_BASED_OUTPATIENT_CLINIC_OR_DEPARTMENT_OTHER): Payer: Medicare Other

## 2015-06-12 ENCOUNTER — Telehealth: Payer: Self-pay

## 2015-06-12 ENCOUNTER — Ambulatory Visit (HOSPITAL_BASED_OUTPATIENT_CLINIC_OR_DEPARTMENT_OTHER): Payer: Medicare Other | Admitting: Hematology

## 2015-06-12 VITALS — BP 127/72 | HR 97 | Temp 98.4°F | Resp 18 | Ht 69.0 in | Wt 174.0 lb

## 2015-06-12 DIAGNOSIS — F319 Bipolar disorder, unspecified: Secondary | ICD-10-CM

## 2015-06-12 DIAGNOSIS — D72819 Decreased white blood cell count, unspecified: Secondary | ICD-10-CM

## 2015-06-12 DIAGNOSIS — D649 Anemia, unspecified: Secondary | ICD-10-CM

## 2015-06-12 DIAGNOSIS — N189 Chronic kidney disease, unspecified: Secondary | ICD-10-CM

## 2015-06-12 LAB — COMPREHENSIVE METABOLIC PANEL
ALT: <9 U/L (ref 0–55)
ANION GAP: 7 meq/L (ref 3–11)
AST: 16 U/L (ref 5–34)
Albumin: 3.5 g/dL (ref 3.5–5.0)
Alkaline Phosphatase: 75 U/L (ref 40–150)
BILIRUBIN TOTAL: 0.33 mg/dL (ref 0.20–1.20)
BUN: 13.7 mg/dL (ref 7.0–26.0)
CHLORIDE: 108 meq/L (ref 98–109)
CO2: 26 meq/L (ref 22–29)
Calcium: 9.4 mg/dL (ref 8.4–10.4)
Creatinine: 1.6 mg/dL — ABNORMAL HIGH (ref 0.7–1.3)
EGFR: 45 mL/min/{1.73_m2} — ABNORMAL LOW (ref >90)
GLUCOSE: 112 mg/dL (ref 70–140)
Potassium: 4.1 mEq/L (ref 3.5–5.1)
SODIUM: 140 meq/L (ref 136–145)
TOTAL PROTEIN: 7.3 g/dL (ref 6.4–8.3)

## 2015-06-12 LAB — CBC & DIFF AND RETIC
BASO%: 0.9 % (ref 0.0–2.0)
Basophils Absolute: 0 10*3/uL (ref 0.0–0.1)
EOS%: 6.1 % (ref 0.0–7.0)
Eosinophils Absolute: 0.1 10*3/uL (ref 0.0–0.5)
HCT: 37.6 % — ABNORMAL LOW (ref 38.4–49.9)
HGB: 13 g/dL (ref 13.0–17.1)
IMMATURE RETIC FRACT: 5.1 % (ref 3.00–10.60)
LYMPH#: 0.6 10*3/uL — AB (ref 0.9–3.3)
LYMPH%: 30.2 % (ref 14.0–49.0)
MCH: 29.7 pg (ref 27.2–33.4)
MCHC: 34.6 g/dL (ref 32.0–36.0)
MCV: 85.8 fL (ref 79.3–98.0)
MONO#: 0.4 10*3/uL (ref 0.1–0.9)
MONO%: 20.8 % — ABNORMAL HIGH (ref 0.0–14.0)
NEUT%: 42 % (ref 39.0–75.0)
NEUTROS ABS: 0.9 10*3/uL — AB (ref 1.5–6.5)
Platelets: 171 10*3/uL (ref 140–400)
RBC: 4.38 10*6/uL (ref 4.20–5.82)
RDW: 13 % (ref 11.0–14.6)
Retic %: 1.76 % (ref 0.80–1.80)
Retic Ct Abs: 77.09 10*3/uL (ref 34.80–93.90)
WBC: 2.1 10*3/uL — AB (ref 4.0–10.3)
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
## 2015-06-12 NOTE — Telephone Encounter (Signed)
per pof to sch pt appt-gave pt copy of avs °

## 2015-06-12 NOTE — Telephone Encounter (Signed)
Called to speak with patient about tele-medicine. Spoke with patient, as this nurse was speaking with the patient , call was disconnected.

## 2015-08-09 DIAGNOSIS — N183 Chronic kidney disease, stage 3 (moderate): Secondary | ICD-10-CM | POA: Diagnosis not present

## 2015-09-24 ENCOUNTER — Encounter: Payer: Self-pay | Admitting: Internal Medicine

## 2015-09-25 DIAGNOSIS — Z125 Encounter for screening for malignant neoplasm of prostate: Secondary | ICD-10-CM | POA: Diagnosis not present

## 2015-09-25 DIAGNOSIS — N183 Chronic kidney disease, stage 3 (moderate): Secondary | ICD-10-CM | POA: Diagnosis not present

## 2015-09-25 DIAGNOSIS — E784 Other hyperlipidemia: Secondary | ICD-10-CM | POA: Diagnosis not present

## 2015-09-30 DIAGNOSIS — R161 Splenomegaly, not elsewhere classified: Secondary | ICD-10-CM | POA: Diagnosis not present

## 2015-09-30 DIAGNOSIS — Z6825 Body mass index (BMI) 25.0-25.9, adult: Secondary | ICD-10-CM | POA: Diagnosis not present

## 2015-09-30 DIAGNOSIS — F39 Unspecified mood [affective] disorder: Secondary | ICD-10-CM | POA: Diagnosis not present

## 2015-09-30 DIAGNOSIS — F3189 Other bipolar disorder: Secondary | ICD-10-CM | POA: Diagnosis not present

## 2015-09-30 DIAGNOSIS — E784 Other hyperlipidemia: Secondary | ICD-10-CM | POA: Diagnosis not present

## 2015-09-30 DIAGNOSIS — K219 Gastro-esophageal reflux disease without esophagitis: Secondary | ICD-10-CM | POA: Diagnosis not present

## 2015-09-30 DIAGNOSIS — Z1389 Encounter for screening for other disorder: Secondary | ICD-10-CM | POA: Diagnosis not present

## 2015-09-30 DIAGNOSIS — Z0000 Encounter for general adult medical examination without abnormal findings: Secondary | ICD-10-CM | POA: Diagnosis not present

## 2015-09-30 DIAGNOSIS — K5730 Diverticulosis of large intestine without perforation or abscess without bleeding: Secondary | ICD-10-CM | POA: Diagnosis not present

## 2015-09-30 DIAGNOSIS — N183 Chronic kidney disease, stage 3 (moderate): Secondary | ICD-10-CM | POA: Diagnosis not present

## 2015-09-30 DIAGNOSIS — Z125 Encounter for screening for malignant neoplasm of prostate: Secondary | ICD-10-CM | POA: Diagnosis not present

## 2015-09-30 DIAGNOSIS — D72819 Decreased white blood cell count, unspecified: Secondary | ICD-10-CM | POA: Diagnosis not present

## 2015-12-05 DIAGNOSIS — R161 Splenomegaly, not elsewhere classified: Secondary | ICD-10-CM | POA: Diagnosis not present

## 2015-12-05 DIAGNOSIS — N183 Chronic kidney disease, stage 3 (moderate): Secondary | ICD-10-CM | POA: Diagnosis not present

## 2015-12-05 DIAGNOSIS — I10 Essential (primary) hypertension: Secondary | ICD-10-CM | POA: Diagnosis not present

## 2015-12-05 DIAGNOSIS — R768 Other specified abnormal immunological findings in serum: Secondary | ICD-10-CM | POA: Diagnosis not present

## 2015-12-05 DIAGNOSIS — E7800 Pure hypercholesterolemia, unspecified: Secondary | ICD-10-CM | POA: Diagnosis not present

## 2015-12-05 DIAGNOSIS — Z23 Encounter for immunization: Secondary | ICD-10-CM | POA: Diagnosis not present

## 2015-12-05 DIAGNOSIS — D72819 Decreased white blood cell count, unspecified: Secondary | ICD-10-CM | POA: Diagnosis not present

## 2016-02-17 DIAGNOSIS — L309 Dermatitis, unspecified: Secondary | ICD-10-CM | POA: Diagnosis not present

## 2016-02-17 DIAGNOSIS — Z6825 Body mass index (BMI) 25.0-25.9, adult: Secondary | ICD-10-CM | POA: Diagnosis not present

## 2016-04-05 IMAGING — CT CT HEAD W/O CM
1 series · 16 of 30 positions shown, 20 images · non-contrast
Comparison: CT HEAD W/O CM dated 07/19/2013

CLINICAL DATA: tonight at [id number], eval [facility]

EXAM:
CT HEAD WITHOUT CONTRAST
TECHNIQUE: Contiguous axial images were obtained from the base of the skull
through the vertex without intravenous contrast.

[Series 2: head 5.0 h30s · axial · 0.42mm/px · z∈[-118,+22]mm · 16 of 32 slices shown, 20 images]
[im 2/32  brain]
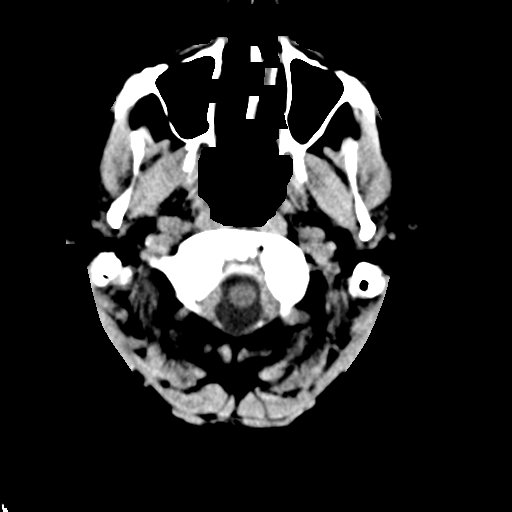
[im 2/32  bone]
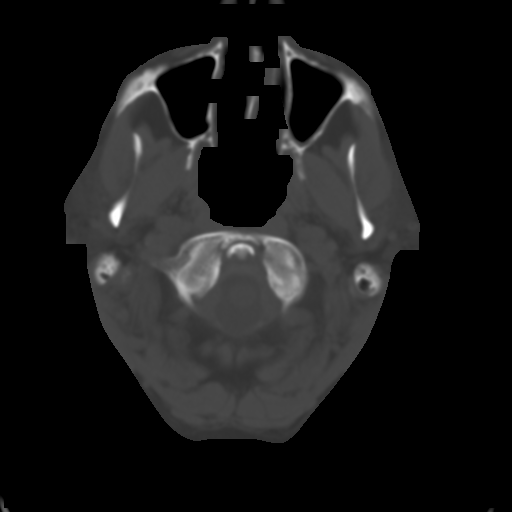
[im 4/32  brain]
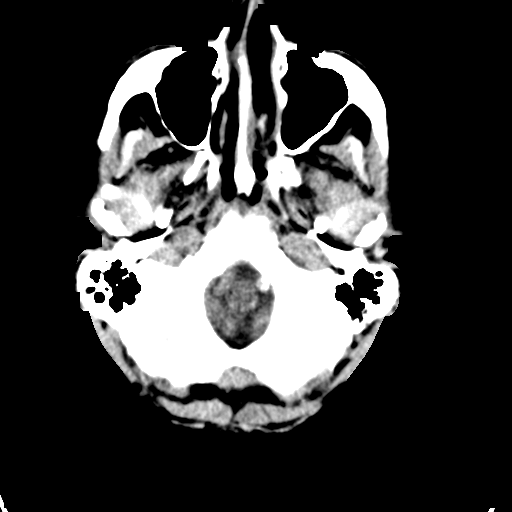
[im 6/32  brain]
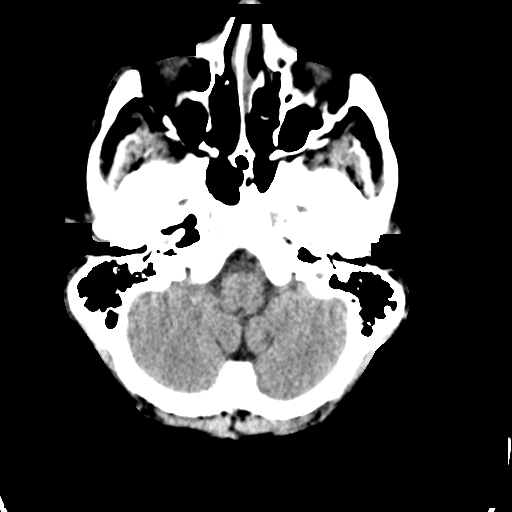
[im 8/32  brain]
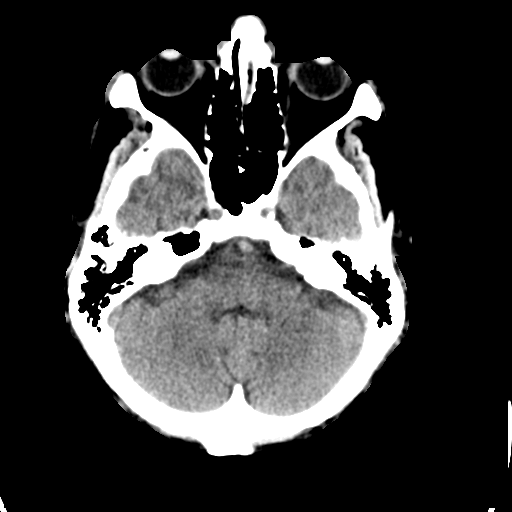
[im 9/32  brain]
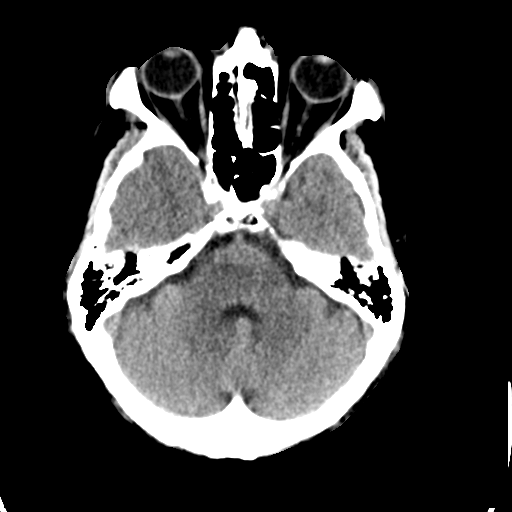
[im 9/32  bone]
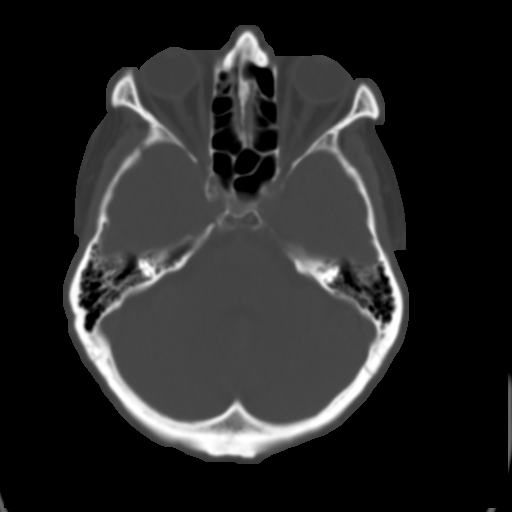
[im 11/32  brain]
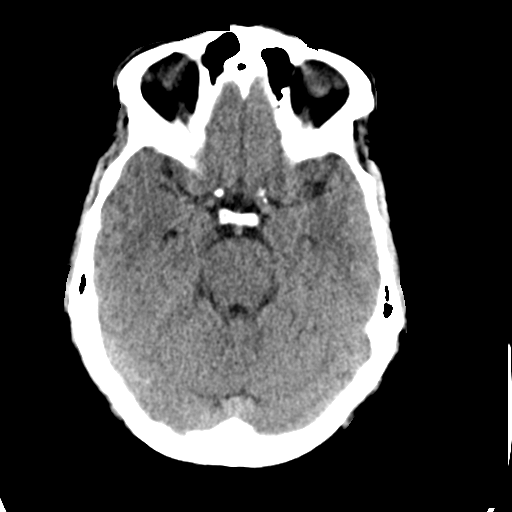
[im 13/32  brain]
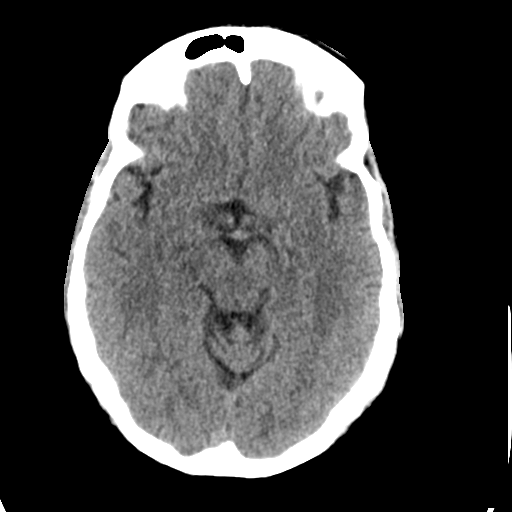
[im 15/32  brain]
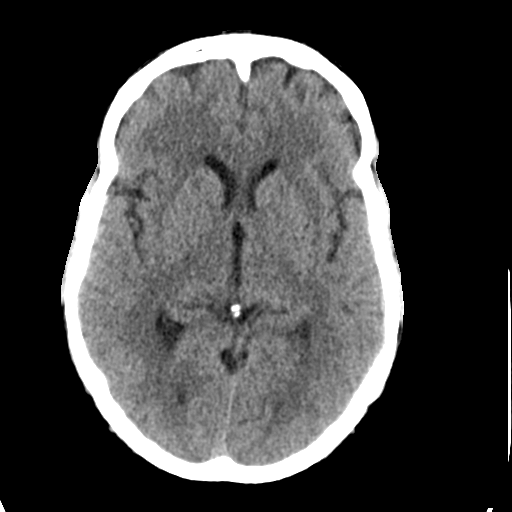
[im 17/32  brain]
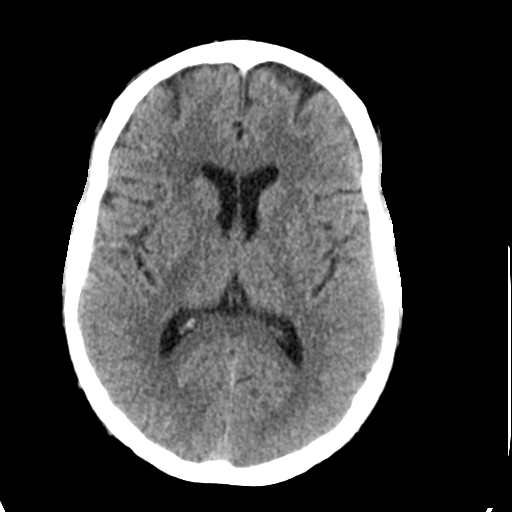
[im 17/32  bone]
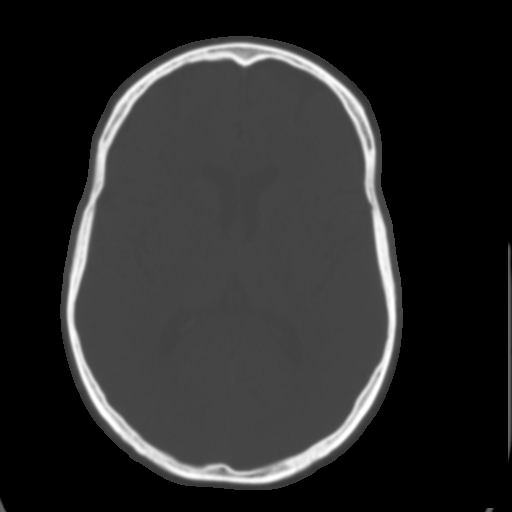
[im 19/32  brain]
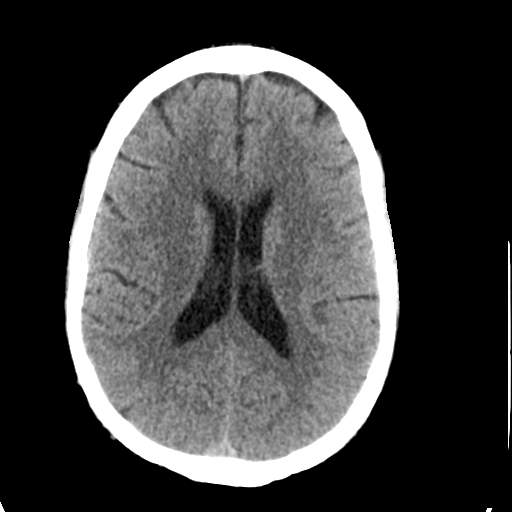
[im 21/32  brain]
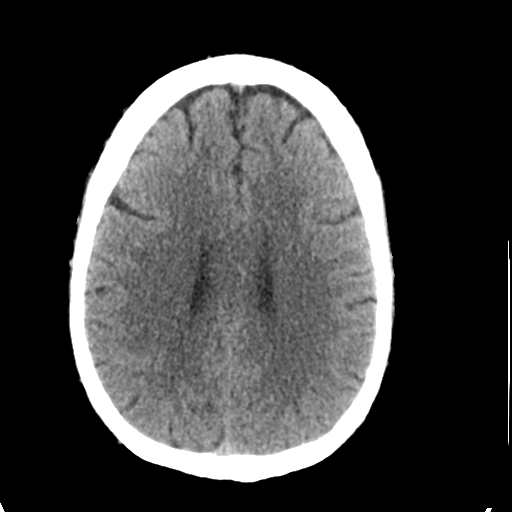
[im 23/32  brain]
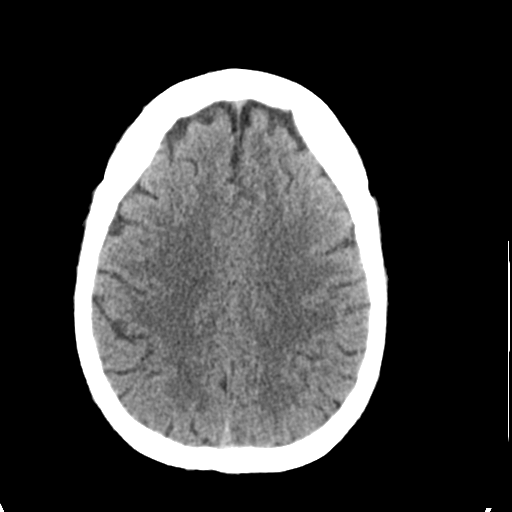
[im 24/32  brain]
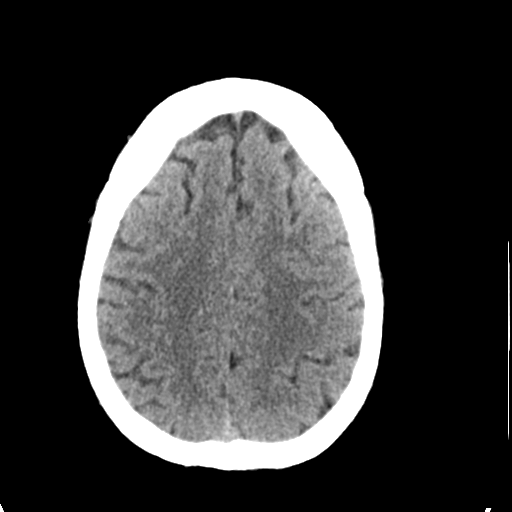
[im 24/32  bone]
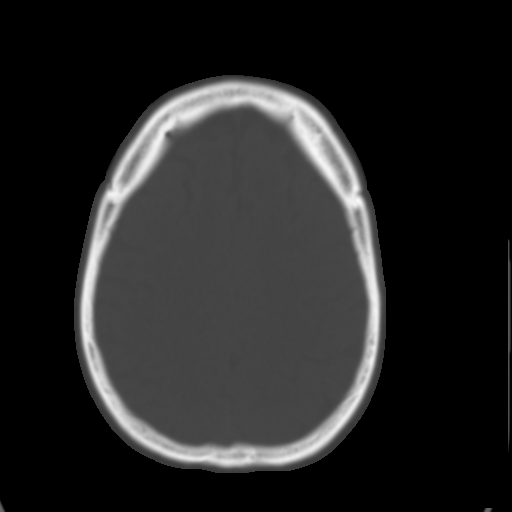
[im 26/32  brain]
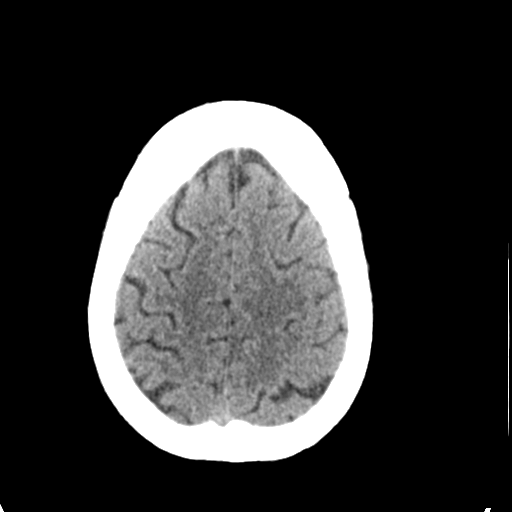
[im 28/32  brain]
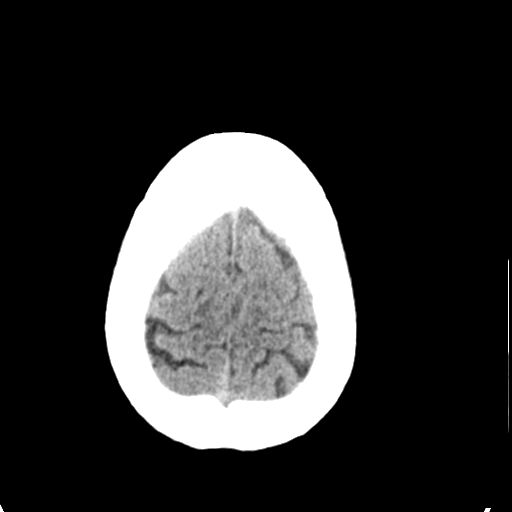
[im 30/32  brain]
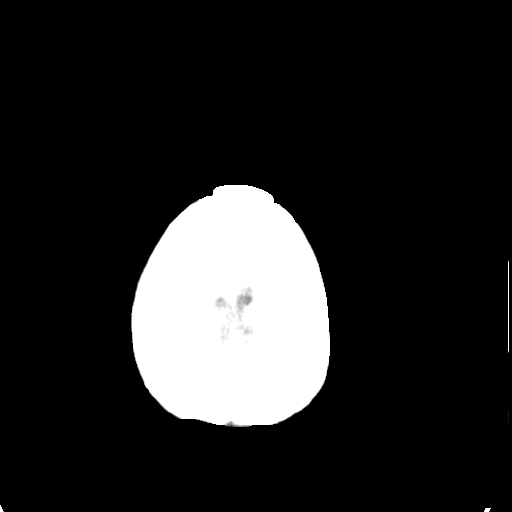

[16 of 30 positions shown; findings below may reference images not displayed]

FINDINGS: The previously described area of small volume subarachnoid
hemorrhage in the left frontal region is slightly less conspicuous
on the present study. No further intra-axial or extra-axial fluid
collections are appreciated. There is no evidence of mass effect. No
focal or acute osseous abnormalities. There is no evidence of
cortical based areas of acute infarction. The visualized paranasal
sinuses and mastoid air cells are patent.
IMPRESSION: Slight decrease conspicuity of the small volume left frontal
subarachnoid hemorrhage. No further focal acute abnormalities
appreciated.

## 2016-05-04 ENCOUNTER — Telehealth: Payer: Self-pay | Admitting: *Deleted

## 2016-05-04 NOTE — Telephone Encounter (Signed)
Patient called stating that he has a reoccurring red itching rash over his body. Patient states that this probably started around the same time he was lying on a couch that possibly had mold. Patient went to his PCP in which they gave him cortisone cream. Patient denies fever and night sweats. Patient verbalized understanding. MD Burr Medico notified and instructed patient to visit PCP to be evaluated.

## 2016-05-27 DIAGNOSIS — L308 Other specified dermatitis: Secondary | ICD-10-CM | POA: Diagnosis not present

## 2016-05-27 DIAGNOSIS — L821 Other seborrheic keratosis: Secondary | ICD-10-CM | POA: Diagnosis not present

## 2016-05-27 DIAGNOSIS — L570 Actinic keratosis: Secondary | ICD-10-CM | POA: Diagnosis not present
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
## 2016-06-11 ENCOUNTER — Ambulatory Visit (HOSPITAL_BASED_OUTPATIENT_CLINIC_OR_DEPARTMENT_OTHER): Payer: Medicare Other | Admitting: Hematology

## 2016-06-11 ENCOUNTER — Other Ambulatory Visit (HOSPITAL_BASED_OUTPATIENT_CLINIC_OR_DEPARTMENT_OTHER): Payer: Medicare Other

## 2016-06-11 ENCOUNTER — Telehealth: Payer: Self-pay | Admitting: Hematology

## 2016-06-11 VITALS — BP 127/79 | HR 81 | Temp 97.6°F | Resp 18 | Ht 69.0 in | Wt 171.5 lb

## 2016-06-11 DIAGNOSIS — F319 Bipolar disorder, unspecified: Secondary | ICD-10-CM

## 2016-06-11 DIAGNOSIS — D72819 Decreased white blood cell count, unspecified: Secondary | ICD-10-CM

## 2016-06-11 DIAGNOSIS — D709 Neutropenia, unspecified: Secondary | ICD-10-CM

## 2016-06-11 LAB — COMPREHENSIVE METABOLIC PANEL
ALT: 10 U/L (ref 0–55)
AST: 14 U/L (ref 5–34)
Albumin: 3.8 g/dL (ref 3.5–5.0)
Alkaline Phosphatase: 106 U/L (ref 40–150)
Anion Gap: 9 mEq/L (ref 3–11)
BUN: 11.1 mg/dL (ref 7.0–26.0)
CO2: 26 meq/L (ref 22–29)
Calcium: 9.9 mg/dL (ref 8.4–10.4)
Chloride: 105 mEq/L (ref 98–109)
Creatinine: 1.2 mg/dL (ref 0.7–1.3)
EGFR: 60 mL/min/{1.73_m2} — ABNORMAL LOW (ref >90)
GLUCOSE: 99 mg/dL (ref 70–140)
Potassium: 4.4 mEq/L (ref 3.5–5.1)
Sodium: 140 mEq/L (ref 136–145)
TOTAL PROTEIN: 7.5 g/dL (ref 6.4–8.3)
Total Bilirubin: 0.32 mg/dL (ref 0.20–1.20)

## 2016-06-11 LAB — CBC & DIFF AND RETIC
BASO%: 0.8 % (ref 0.0–2.0)
Basophils Absolute: 0 10*3/uL (ref 0.0–0.1)
EOS%: 10.8 % — ABNORMAL HIGH (ref 0.0–7.0)
Eosinophils Absolute: 0.3 10*3/uL (ref 0.0–0.5)
HCT: 38.9 % (ref 38.4–49.9)
HGB: 13.2 g/dL (ref 13.0–17.1)
IMMATURE RETIC FRACT: 5.1 % (ref 3.00–10.60)
LYMPH%: 25 % (ref 14.0–49.0)
MCH: 28.1 pg (ref 27.2–33.4)
MCHC: 33.9 g/dL (ref 32.0–36.0)
MCV: 82.8 fL (ref 79.3–98.0)
MONO#: 0.3 10*3/uL (ref 0.1–0.9)
MONO%: 12.5 % (ref 0.0–14.0)
NEUT#: 1.2 10*3/uL — ABNORMAL LOW (ref 1.5–6.5)
NEUT%: 50.9 % (ref 39.0–75.0)
PLATELETS: 202 10*3/uL (ref 140–400)
RBC: 4.7 10*6/uL (ref 4.20–5.82)
RDW: 13.5 % (ref 11.0–14.6)
RETIC CT ABS: 67.21 10*3/uL (ref 34.80–93.90)
Retic %: 1.43 % (ref 0.80–1.80)
WBC: 2.4 10*3/uL — ABNORMAL LOW (ref 4.0–10.3)
lymph#: 0.6 10*3/uL — ABNORMAL LOW (ref 0.9–3.3)

## 2016-06-11 NOTE — Telephone Encounter (Signed)
Gave patient AVS and calender per 4/12 los. f/u in 1 yr

## 2016-06-13 ENCOUNTER — Encounter: Payer: Self-pay | Admitting: Hematology

## 2016-06-18 DIAGNOSIS — I10 Essential (primary) hypertension: Secondary | ICD-10-CM | POA: Diagnosis not present

## 2016-06-18 DIAGNOSIS — R768 Other specified abnormal immunological findings in serum: Secondary | ICD-10-CM | POA: Diagnosis not present

## 2016-06-18 DIAGNOSIS — F419 Anxiety disorder, unspecified: Secondary | ICD-10-CM | POA: Diagnosis not present

## 2016-06-18 DIAGNOSIS — M8700 Idiopathic aseptic necrosis of unspecified bone: Secondary | ICD-10-CM | POA: Diagnosis not present

## 2016-06-18 DIAGNOSIS — D72819 Decreased white blood cell count, unspecified: Secondary | ICD-10-CM | POA: Diagnosis not present

## 2016-06-18 DIAGNOSIS — N183 Chronic kidney disease, stage 3 (moderate): Secondary | ICD-10-CM | POA: Diagnosis not present

## 2016-06-18 DIAGNOSIS — E7800 Pure hypercholesterolemia, unspecified: Secondary | ICD-10-CM | POA: Diagnosis not present

## 2016-07-06 DIAGNOSIS — N183 Chronic kidney disease, stage 3 (moderate): Secondary | ICD-10-CM | POA: Diagnosis not present

## 2016-09-25 DIAGNOSIS — E784 Other hyperlipidemia: Secondary | ICD-10-CM | POA: Diagnosis not present

## 2016-09-25 DIAGNOSIS — Z0000 Encounter for general adult medical examination without abnormal findings: Secondary | ICD-10-CM | POA: Diagnosis not present

## 2016-09-25 DIAGNOSIS — Z125 Encounter for screening for malignant neoplasm of prostate: Secondary | ICD-10-CM | POA: Diagnosis not present

## 2016-09-25 DIAGNOSIS — N183 Chronic kidney disease, stage 3 (moderate): Secondary | ICD-10-CM | POA: Diagnosis not present

## 2016-12-18 DIAGNOSIS — N183 Chronic kidney disease, stage 3 (moderate): Secondary | ICD-10-CM | POA: Diagnosis not present

## 2017-02-04 DIAGNOSIS — E7800 Pure hypercholesterolemia, unspecified: Secondary | ICD-10-CM | POA: Diagnosis not present

## 2017-02-04 DIAGNOSIS — R161 Splenomegaly, not elsewhere classified: Secondary | ICD-10-CM | POA: Diagnosis not present

## 2017-02-04 DIAGNOSIS — N183 Chronic kidney disease, stage 3 (moderate): Secondary | ICD-10-CM | POA: Diagnosis not present

## 2017-02-04 DIAGNOSIS — R768 Other specified abnormal immunological findings in serum: Secondary | ICD-10-CM | POA: Diagnosis not present

## 2017-02-04 DIAGNOSIS — I129 Hypertensive chronic kidney disease with stage 1 through stage 4 chronic kidney disease, or unspecified chronic kidney disease: Secondary | ICD-10-CM | POA: Diagnosis not present

## 2017-02-04 DIAGNOSIS — D72819 Decreased white blood cell count, unspecified: Secondary | ICD-10-CM | POA: Diagnosis not present

## 2017-02-04 DIAGNOSIS — Z23 Encounter for immunization: Secondary | ICD-10-CM | POA: Diagnosis not present

## 2017-06-11 ENCOUNTER — Other Ambulatory Visit: Payer: Medicare Other

## 2017-06-11 ENCOUNTER — Ambulatory Visit: Payer: Medicare Other | Admitting: Hematology

## 2017-06-22 DIAGNOSIS — N183 Chronic kidney disease, stage 3 (moderate): Secondary | ICD-10-CM | POA: Diagnosis not present

## 2017-08-03 DIAGNOSIS — E7800 Pure hypercholesterolemia, unspecified: Secondary | ICD-10-CM | POA: Diagnosis not present

## 2017-08-03 DIAGNOSIS — N051 Unspecified nephritic syndrome with focal and segmental glomerular lesions: Secondary | ICD-10-CM | POA: Diagnosis not present

## 2017-08-03 DIAGNOSIS — F419 Anxiety disorder, unspecified: Secondary | ICD-10-CM | POA: Diagnosis not present

## 2017-08-03 DIAGNOSIS — D72819 Decreased white blood cell count, unspecified: Secondary | ICD-10-CM | POA: Diagnosis not present

## 2017-08-03 DIAGNOSIS — N183 Chronic kidney disease, stage 3 (moderate): Secondary | ICD-10-CM | POA: Diagnosis not present

## 2017-08-03 DIAGNOSIS — R768 Other specified abnormal immunological findings in serum: Secondary | ICD-10-CM | POA: Diagnosis not present

## 2017-08-03 DIAGNOSIS — I129 Hypertensive chronic kidney disease with stage 1 through stage 4 chronic kidney disease, or unspecified chronic kidney disease: Secondary | ICD-10-CM | POA: Diagnosis not present

## 2017-09-30 DIAGNOSIS — Z125 Encounter for screening for malignant neoplasm of prostate: Secondary | ICD-10-CM | POA: Diagnosis not present

## 2017-09-30 DIAGNOSIS — R82998 Other abnormal findings in urine: Secondary | ICD-10-CM | POA: Diagnosis not present

## 2017-09-30 DIAGNOSIS — E7849 Other hyperlipidemia: Secondary | ICD-10-CM | POA: Diagnosis not present

## 2017-10-07 DIAGNOSIS — Z1389 Encounter for screening for other disorder: Secondary | ICD-10-CM | POA: Diagnosis not present

## 2017-10-07 DIAGNOSIS — Z0000 Encounter for general adult medical examination without abnormal findings: Secondary | ICD-10-CM | POA: Diagnosis not present

## 2018-02-08 DIAGNOSIS — N183 Chronic kidney disease, stage 3 (moderate): Secondary | ICD-10-CM | POA: Diagnosis not present

## 2018-03-24 DIAGNOSIS — D72819 Decreased white blood cell count, unspecified: Secondary | ICD-10-CM | POA: Diagnosis not present

## 2018-03-24 DIAGNOSIS — N183 Chronic kidney disease, stage 3 (moderate): Secondary | ICD-10-CM | POA: Diagnosis not present

## 2018-03-24 DIAGNOSIS — F419 Anxiety disorder, unspecified: Secondary | ICD-10-CM | POA: Diagnosis not present

## 2018-03-24 DIAGNOSIS — M8700 Idiopathic aseptic necrosis of unspecified bone: Secondary | ICD-10-CM | POA: Diagnosis not present

## 2018-03-24 DIAGNOSIS — E7800 Pure hypercholesterolemia, unspecified: Secondary | ICD-10-CM | POA: Diagnosis not present

## 2018-03-24 DIAGNOSIS — R768 Other specified abnormal immunological findings in serum: Secondary | ICD-10-CM | POA: Diagnosis not present

## 2018-03-24 DIAGNOSIS — I129 Hypertensive chronic kidney disease with stage 1 through stage 4 chronic kidney disease, or unspecified chronic kidney disease: Secondary | ICD-10-CM | POA: Diagnosis not present

## 2018-03-24 DIAGNOSIS — Z23 Encounter for immunization: Secondary | ICD-10-CM | POA: Diagnosis not present

## 2018-03-24 DIAGNOSIS — R161 Splenomegaly, not elsewhere classified: Secondary | ICD-10-CM | POA: Diagnosis not present

## 2018-05-09 DIAGNOSIS — N183 Chronic kidney disease, stage 3 (moderate): Secondary | ICD-10-CM | POA: Diagnosis not present

## 2018-08-10 DIAGNOSIS — N183 Chronic kidney disease, stage 3 (moderate): Secondary | ICD-10-CM | POA: Diagnosis not present

## 2018-09-20 DIAGNOSIS — N183 Chronic kidney disease, stage 3 (moderate): Secondary | ICD-10-CM | POA: Diagnosis not present

## 2018-09-20 DIAGNOSIS — I129 Hypertensive chronic kidney disease with stage 1 through stage 4 chronic kidney disease, or unspecified chronic kidney disease: Secondary | ICD-10-CM | POA: Diagnosis not present

## 2018-09-20 DIAGNOSIS — R768 Other specified abnormal immunological findings in serum: Secondary | ICD-10-CM | POA: Diagnosis not present

## 2018-09-20 DIAGNOSIS — E7800 Pure hypercholesterolemia, unspecified: Secondary | ICD-10-CM | POA: Diagnosis not present

## 2018-09-20 DIAGNOSIS — D72819 Decreased white blood cell count, unspecified: Secondary | ICD-10-CM | POA: Diagnosis not present

## 2018-10-05 DIAGNOSIS — E7849 Other hyperlipidemia: Secondary | ICD-10-CM | POA: Diagnosis not present

## 2018-10-06 DIAGNOSIS — R82998 Other abnormal findings in urine: Secondary | ICD-10-CM | POA: Diagnosis not present

## 2018-10-06 DIAGNOSIS — Z0000 Encounter for general adult medical examination without abnormal findings: Secondary | ICD-10-CM | POA: Diagnosis not present

## 2018-10-06 DIAGNOSIS — R7989 Other specified abnormal findings of blood chemistry: Secondary | ICD-10-CM | POA: Diagnosis not present

## 2018-10-06 DIAGNOSIS — I10 Essential (primary) hypertension: Secondary | ICD-10-CM | POA: Diagnosis not present

## 2018-10-06 DIAGNOSIS — N183 Chronic kidney disease, stage 3 (moderate): Secondary | ICD-10-CM | POA: Diagnosis not present

## 2018-10-14 ENCOUNTER — Encounter: Payer: Self-pay | Admitting: Internal Medicine

## 2018-11-08 ENCOUNTER — Other Ambulatory Visit: Payer: Self-pay

## 2018-11-08 ENCOUNTER — Ambulatory Visit (AMBULATORY_SURGERY_CENTER): Payer: Self-pay

## 2018-11-08 VITALS — Ht 69.0 in | Wt 175.2 lb

## 2018-11-08 DIAGNOSIS — Z1211 Encounter for screening for malignant neoplasm of colon: Secondary | ICD-10-CM

## 2018-11-08 NOTE — Progress Notes (Signed)
Denies allergies to eggs or soy products. Denies complication of anesthesia or sedation. Denies use of weight loss medication. Denies use of O2.   Emmi instructions given for colonoscopy.  

## 2018-11-14 DIAGNOSIS — N183 Chronic kidney disease, stage 3 (moderate): Secondary | ICD-10-CM | POA: Diagnosis not present

## 2018-11-18 ENCOUNTER — Telehealth: Payer: Self-pay | Admitting: Internal Medicine

## 2018-11-18 NOTE — Telephone Encounter (Signed)

## 2018-11-21 ENCOUNTER — Encounter: Payer: Self-pay | Admitting: Internal Medicine

## 2018-11-21 ENCOUNTER — Other Ambulatory Visit: Payer: Self-pay

## 2018-11-21 ENCOUNTER — Ambulatory Visit (AMBULATORY_SURGERY_CENTER): Payer: Medicare Other | Admitting: Internal Medicine

## 2018-11-21 VITALS — BP 129/88 | HR 77 | Temp 95.7°F | Resp 20 | Ht 69.0 in | Wt 175.0 lb

## 2018-11-21 DIAGNOSIS — Z1211 Encounter for screening for malignant neoplasm of colon: Secondary | ICD-10-CM

## 2018-11-21 MED ORDER — SODIUM CHLORIDE 0.9 % IV SOLN: 500.0000 mL | Freq: Once | INTRAVENOUS | Status: DC

## 2018-11-21 NOTE — Op Note (Signed)
[facility] Patient Name: Hector Gomez Procedure Date: 11/21/2018 10:42 AM MRN: [id number] Endoscopist: Gatha Mayer , MD Age: 68 Referring MD:  Date of Birth: 10/09/1950 Gender: Male Account #: [id number] Procedure:                Colonoscopy Indications:              Screening for colorectal malignant neoplasm Medicines:                Propofol per Anesthesia, Monitored Anesthesia Care Procedure:                Pre-Anesthesia Assessment:                           - Prior to the procedure, a History and Physical                            was performed, and patient medications and                            allergies were reviewed. The patient's tolerance of                            previous anesthesia was also reviewed. The risks                            and benefits of the procedure and the sedation                            options and risks were discussed with the patient.                            All questions were answered, and informed consent                            was obtained. Prior Anticoagulants: The patient has                            taken no previous anticoagulant or antiplatelet                            agents. ASA Grade Assessment: II - A patient with                            mild systemic disease. After reviewing the risks                            and benefits, the patient was deemed in                            satisfactory condition to undergo the procedure.                           After obtaining informed consent, the colonoscope  was passed under direct vision. Throughout the                            procedure, the patient's blood pressure, pulse, and                            oxygen saturations were monitored continuously. The                            Colonoscope was introduced through the anus and                            advanced to the the cecum, identified by   appendiceal orifice and ileocecal valve. The                            colonoscopy was performed without difficulty. The                            patient tolerated the procedure well. The quality                            of the bowel preparation was excellent. The                            ileocecal valve, appendiceal orifice, and rectum                            were photographed. The bowel preparation used was                            Miralax via split dose instruction. Scope In: 11:01:56 AM Scope Out: 11:15:32 AM Scope Withdrawal Time: 0 hours 8 minutes 35 seconds  Total Procedure Duration: 0 hours 13 minutes 36 seconds  Findings:                 The perianal and digital rectal examinations were                            normal.                           Multiple diverticula were found in the left colon.                            Estimated blood loss: none.                           The exam was otherwise without abnormality on                            direct and retroflexion views. Complications:            No immediate complications. Estimated Blood Loss:     Estimated blood loss: none. Impression:               -  Diverticulosis in the left colon.                           - The examination was otherwise normal on direct                            and retroflexion views.                           - No specimens collected. Recommendation:           - Patient has a contact number available for                            emergencies. The signs and symptoms of potential                            delayed complications were discussed with the                            patient. Return to normal activities tomorrow.                            Written discharge instructions were provided to the                            patient.                           - Resume previous diet.                           - Continue present medications.                           - High fiber  diet.                           - No repeat colonoscopy due to current age (43                            years or older) and the absence of colonic polyps.                            Does not need annual hemoccults either. This covers                            screening for 10 years and he will be aged out then. Gatha Mayer, MD 11/21/2018 11:23:03 AM This report has been signed electronically.

## 2018-11-21 NOTE — Patient Instructions (Addendum)
No polyps or cancer seen.  You do have diverticulosis - thickened muscle rings and pouches in the colon wall. Please read the handout about this condition.  I appreciate the opportunity to care for you. Gatha Mayer, MD, [facility]   Information on diverticulosis and high fiber diet  given to you today   YOU HAD AN ENDOSCOPIC PROCEDURE TODAY AT [facility]:   Refer to the procedure report that was given to you for any specific questions about what was found during the examination.  If the procedure report does not answer your questions, please call your gastroenterologist to clarify.  If you requested that your care partner not be given the details of your procedure findings, then the procedure report has been included in a sealed envelope for you to review at your convenience later.  YOU SHOULD EXPECT: Some feelings of bloating in the abdomen. Passage of more gas than usual.  Walking can help get rid of the air that was put into your GI tract during the procedure and reduce the bloating. If you had a lower endoscopy (such as a colonoscopy or flexible sigmoidoscopy) you may notice spotting of blood in your stool or on the toilet paper. If you underwent a bowel prep for your procedure, you may not have a normal bowel movement for a few days.  Please Note:  You might notice some irritation and congestion in your nose or some drainage.  This is from the oxygen used during your procedure.  There is no need for concern and it should clear up in a day or so.  SYMPTOMS TO REPORT IMMEDIATELY:   Following lower endoscopy (colonoscopy or flexible sigmoidoscopy):  Excessive amounts of blood in the stool  Significant tenderness or worsening of abdominal pains  Swelling of the abdomen that is new, acute  Fever of 100F or higher    For urgent or emergent issues, a gastroenterologist can be reached at any hour by calling [phone].   DIET:  We do recommend a small meal at first,  but then you may proceed to your regular diet.  Drink plenty of fluids but you should avoid alcoholic beverages for 24 hours.  ACTIVITY:  You should plan to take it easy for the rest of today and you should NOT DRIVE or use heavy machinery until tomorrow (because of the sedation medicines used during the test).    FOLLOW UP: Our staff will call the number listed on your records 48-72 hours following your procedure to check on you and address any questions or concerns that you may have regarding the information given to you following your procedure. If we do not reach you, we will leave a message.  We will attempt to reach you two times.  During this call, we will ask if you have developed any symptoms of COVID 19. If you develop any symptoms (ie: fever, flu-like symptoms, shortness of breath, cough etc.) before then, please call [phone].  If you test positive for Covid 19 in the 2 weeks post procedure, please call and report this information to [place].    If any biopsies were taken you will be contacted by phone or by letter within the next 1-3 weeks.  Please call us at [phone] if you have not heard about the biopsies in 3 weeks.    SIGNATURES/CONFIDENTIALITY: You and/or your care partner have signed paperwork which will be entered into your electronic medical record.  These signatures attest to the fact that  that the information above on your After Visit Summary has been reviewed and is understood.  Full responsibility of the confidentiality of this discharge information lies with you and/or your care-partner.

## 2018-11-21 NOTE — Progress Notes (Signed)
Pt's states no medical or surgical changes since previsit or office visit.  Temp KA VS CW 

## 2018-11-23 ENCOUNTER — Telehealth: Payer: Self-pay

## 2018-11-23 NOTE — Telephone Encounter (Signed)
NO ANSWER, MESSAGE LEFT FOR PATIENT. 

## 2018-11-23 NOTE — Telephone Encounter (Signed)
  Follow up Call-  Call back number 11/21/2018  Post procedure Call Back phone  # [phone]  Permission to leave phone message Yes  Some recent data might be hidden     Patient questions:  Do you have a fever, pain , or abdominal swelling? No. Pain Score  0 *  Have you tolerated food without any problems? Yes.    Have you been able to return to your normal activities? Yes.    Do you have any questions about your discharge instructions: Diet   No. Medications  No. Follow up visit  No.  Do you have questions or concerns about your Care? No.  Actions: * If pain score is 4 or above: No action needed, pain <4.  1. Have you developed a fever since your procedure?No  2.   Have you had an respiratory symptoms (SOB or cough) since your procedure? No 3.   Have you tested positive for COVID 19 since your procedure No  4.   Have you had any family members/close contacts diagnosed with the COVID 19 since your procedure?  No   If yes to any of these questions please route to Joylene John, RN and Alphonsa Gin, RN.

## 2019-03-28 DIAGNOSIS — N183 Chronic kidney disease, stage 3 unspecified: Secondary | ICD-10-CM | POA: Diagnosis not present

## 2019-03-28 DIAGNOSIS — I129 Hypertensive chronic kidney disease with stage 1 through stage 4 chronic kidney disease, or unspecified chronic kidney disease: Secondary | ICD-10-CM | POA: Diagnosis not present

## 2019-03-28 DIAGNOSIS — D72819 Decreased white blood cell count, unspecified: Secondary | ICD-10-CM | POA: Diagnosis not present

## 2019-03-28 DIAGNOSIS — N051 Unspecified nephritic syndrome with focal and segmental glomerular lesions: Secondary | ICD-10-CM | POA: Diagnosis not present

## 2019-03-28 DIAGNOSIS — F419 Anxiety disorder, unspecified: Secondary | ICD-10-CM | POA: Diagnosis not present

## 2019-04-11 DIAGNOSIS — I129 Hypertensive chronic kidney disease with stage 1 through stage 4 chronic kidney disease, or unspecified chronic kidney disease: Secondary | ICD-10-CM | POA: Diagnosis not present

## 2019-04-11 DIAGNOSIS — N183 Chronic kidney disease, stage 3 unspecified: Secondary | ICD-10-CM | POA: Diagnosis not present

## 2019-05-18 DIAGNOSIS — N183 Chronic kidney disease, stage 3 unspecified: Secondary | ICD-10-CM | POA: Diagnosis not present

## 2019-10-04 DIAGNOSIS — F419 Anxiety disorder, unspecified: Secondary | ICD-10-CM | POA: Diagnosis not present

## 2019-10-04 DIAGNOSIS — D72819 Decreased white blood cell count, unspecified: Secondary | ICD-10-CM | POA: Diagnosis not present

## 2019-10-04 DIAGNOSIS — I129 Hypertensive chronic kidney disease with stage 1 through stage 4 chronic kidney disease, or unspecified chronic kidney disease: Secondary | ICD-10-CM | POA: Diagnosis not present

## 2019-10-04 DIAGNOSIS — E871 Hypo-osmolality and hyponatremia: Secondary | ICD-10-CM | POA: Diagnosis not present

## 2019-10-04 DIAGNOSIS — N183 Chronic kidney disease, stage 3 unspecified: Secondary | ICD-10-CM | POA: Diagnosis not present

## 2019-10-30 DIAGNOSIS — H2513 Age-related nuclear cataract, bilateral: Secondary | ICD-10-CM | POA: Diagnosis not present

## 2019-10-30 DIAGNOSIS — H2512 Age-related nuclear cataract, left eye: Secondary | ICD-10-CM | POA: Diagnosis not present

## 2019-11-09 DIAGNOSIS — R739 Hyperglycemia, unspecified: Secondary | ICD-10-CM | POA: Diagnosis not present

## 2019-11-09 DIAGNOSIS — E785 Hyperlipidemia, unspecified: Secondary | ICD-10-CM | POA: Diagnosis not present

## 2019-11-09 DIAGNOSIS — Z125 Encounter for screening for malignant neoplasm of prostate: Secondary | ICD-10-CM | POA: Diagnosis not present

## 2019-11-16 DIAGNOSIS — H2512 Age-related nuclear cataract, left eye: Secondary | ICD-10-CM | POA: Diagnosis not present

## 2019-11-17 DIAGNOSIS — H2511 Age-related nuclear cataract, right eye: Secondary | ICD-10-CM | POA: Diagnosis not present

## 2019-11-30 DIAGNOSIS — H2511 Age-related nuclear cataract, right eye: Secondary | ICD-10-CM | POA: Diagnosis not present

## 2020-01-09 DIAGNOSIS — S92355A Nondisplaced fracture of fifth metatarsal bone, left foot, initial encounter for closed fracture: Secondary | ICD-10-CM | POA: Diagnosis not present

## 2020-01-09 DIAGNOSIS — M79672 Pain in left foot: Secondary | ICD-10-CM | POA: Diagnosis not present

## 2020-01-19 DIAGNOSIS — M79672 Pain in left foot: Secondary | ICD-10-CM | POA: Diagnosis not present

## 2020-01-19 DIAGNOSIS — S92354G Nondisplaced fracture of fifth metatarsal bone, right foot, subsequent encounter for fracture with delayed healing: Secondary | ICD-10-CM | POA: Diagnosis not present

## 2020-02-15 DIAGNOSIS — Z23 Encounter for immunization: Secondary | ICD-10-CM | POA: Diagnosis not present

## 2020-02-15 DIAGNOSIS — N183 Chronic kidney disease, stage 3 unspecified: Secondary | ICD-10-CM | POA: Diagnosis not present

## 2020-02-19 DIAGNOSIS — M79672 Pain in left foot: Secondary | ICD-10-CM | POA: Diagnosis not present

## 2020-02-19 DIAGNOSIS — S92355A Nondisplaced fracture of fifth metatarsal bone, left foot, initial encounter for closed fracture: Secondary | ICD-10-CM | POA: Diagnosis not present

## 2020-03-22 DIAGNOSIS — S92355A Nondisplaced fracture of fifth metatarsal bone, left foot, initial encounter for closed fracture: Secondary | ICD-10-CM | POA: Diagnosis not present

## 2020-04-19 DIAGNOSIS — S92355D Nondisplaced fracture of fifth metatarsal bone, left foot, subsequent encounter for fracture with routine healing: Secondary | ICD-10-CM | POA: Diagnosis not present

## 2020-05-14 DIAGNOSIS — N183 Chronic kidney disease, stage 3 unspecified: Secondary | ICD-10-CM | POA: Diagnosis not present

## 2020-05-20 DIAGNOSIS — E871 Hypo-osmolality and hyponatremia: Secondary | ICD-10-CM | POA: Diagnosis not present

## 2020-05-20 DIAGNOSIS — F419 Anxiety disorder, unspecified: Secondary | ICD-10-CM | POA: Diagnosis not present

## 2020-05-20 DIAGNOSIS — N183 Chronic kidney disease, stage 3 unspecified: Secondary | ICD-10-CM | POA: Diagnosis not present

## 2020-05-20 DIAGNOSIS — D72819 Decreased white blood cell count, unspecified: Secondary | ICD-10-CM | POA: Diagnosis not present

## 2020-05-20 DIAGNOSIS — I129 Hypertensive chronic kidney disease with stage 1 through stage 4 chronic kidney disease, or unspecified chronic kidney disease: Secondary | ICD-10-CM | POA: Diagnosis not present

## 2020-08-27 DIAGNOSIS — L821 Other seborrheic keratosis: Secondary | ICD-10-CM | POA: Diagnosis not present

## 2020-08-27 DIAGNOSIS — D485 Neoplasm of uncertain behavior of skin: Secondary | ICD-10-CM | POA: Diagnosis not present

## 2020-08-27 DIAGNOSIS — L570 Actinic keratosis: Secondary | ICD-10-CM | POA: Diagnosis not present

## 2020-12-02 DIAGNOSIS — Z125 Encounter for screening for malignant neoplasm of prostate: Secondary | ICD-10-CM | POA: Diagnosis not present

## 2020-12-02 DIAGNOSIS — E785 Hyperlipidemia, unspecified: Secondary | ICD-10-CM | POA: Diagnosis not present

## 2020-12-03 DIAGNOSIS — Z23 Encounter for immunization: Secondary | ICD-10-CM | POA: Diagnosis not present

## 2020-12-03 DIAGNOSIS — D72819 Decreased white blood cell count, unspecified: Secondary | ICD-10-CM | POA: Diagnosis not present

## 2020-12-03 DIAGNOSIS — N1831 Chronic kidney disease, stage 3a: Secondary | ICD-10-CM | POA: Diagnosis not present

## 2020-12-03 DIAGNOSIS — R739 Hyperglycemia, unspecified: Secondary | ICD-10-CM | POA: Diagnosis not present

## 2020-12-03 DIAGNOSIS — K5730 Diverticulosis of large intestine without perforation or abscess without bleeding: Secondary | ICD-10-CM | POA: Diagnosis not present

## 2020-12-03 DIAGNOSIS — F319 Bipolar disorder, unspecified: Secondary | ICD-10-CM | POA: Diagnosis not present

## 2020-12-03 DIAGNOSIS — E871 Hypo-osmolality and hyponatremia: Secondary | ICD-10-CM | POA: Diagnosis not present

## 2020-12-03 DIAGNOSIS — F1099 Alcohol use, unspecified with unspecified alcohol-induced disorder: Secondary | ICD-10-CM | POA: Diagnosis not present

## 2020-12-03 DIAGNOSIS — Z0000 Encounter for general adult medical examination without abnormal findings: Secondary | ICD-10-CM | POA: Diagnosis not present

## 2020-12-03 DIAGNOSIS — F1320 Sedative, hypnotic or anxiolytic dependence, uncomplicated: Secondary | ICD-10-CM | POA: Diagnosis not present

## 2020-12-03 DIAGNOSIS — R030 Elevated blood-pressure reading, without diagnosis of hypertension: Secondary | ICD-10-CM | POA: Diagnosis not present

## 2020-12-03 DIAGNOSIS — E785 Hyperlipidemia, unspecified: Secondary | ICD-10-CM | POA: Diagnosis not present

## 2021-05-12 DIAGNOSIS — N183 Chronic kidney disease, stage 3 unspecified: Secondary | ICD-10-CM | POA: Diagnosis not present

## 2021-05-15 DIAGNOSIS — I129 Hypertensive chronic kidney disease with stage 1 through stage 4 chronic kidney disease, or unspecified chronic kidney disease: Secondary | ICD-10-CM | POA: Diagnosis not present

## 2021-05-15 DIAGNOSIS — R768 Other specified abnormal immunological findings in serum: Secondary | ICD-10-CM | POA: Diagnosis not present

## 2021-05-15 DIAGNOSIS — E871 Hypo-osmolality and hyponatremia: Secondary | ICD-10-CM | POA: Diagnosis not present

## 2021-05-15 DIAGNOSIS — N183 Chronic kidney disease, stage 3 unspecified: Secondary | ICD-10-CM | POA: Diagnosis not present

## 2021-05-15 DIAGNOSIS — N051 Unspecified nephritic syndrome with focal and segmental glomerular lesions: Secondary | ICD-10-CM | POA: Diagnosis not present

## 2021-05-27 DIAGNOSIS — L570 Actinic keratosis: Secondary | ICD-10-CM | POA: Diagnosis not present

## 2021-05-27 DIAGNOSIS — L821 Other seborrheic keratosis: Secondary | ICD-10-CM | POA: Diagnosis not present

## 2021-05-27 DIAGNOSIS — L820 Inflamed seborrheic keratosis: Secondary | ICD-10-CM | POA: Diagnosis not present

## 2021-05-27 DIAGNOSIS — L814 Other melanin hyperpigmentation: Secondary | ICD-10-CM | POA: Diagnosis not present

## 2021-11-19 DIAGNOSIS — N183 Chronic kidney disease, stage 3 unspecified: Secondary | ICD-10-CM | POA: Diagnosis not present

## 2021-12-09 DIAGNOSIS — E785 Hyperlipidemia, unspecified: Secondary | ICD-10-CM | POA: Diagnosis not present

## 2021-12-09 DIAGNOSIS — R7989 Other specified abnormal findings of blood chemistry: Secondary | ICD-10-CM | POA: Diagnosis not present

## 2021-12-09 DIAGNOSIS — R739 Hyperglycemia, unspecified: Secondary | ICD-10-CM | POA: Diagnosis not present

## 2021-12-09 DIAGNOSIS — Z125 Encounter for screening for malignant neoplasm of prostate: Secondary | ICD-10-CM | POA: Diagnosis not present

## 2021-12-16 DIAGNOSIS — F319 Bipolar disorder, unspecified: Secondary | ICD-10-CM | POA: Diagnosis not present

## 2021-12-16 DIAGNOSIS — Z23 Encounter for immunization: Secondary | ICD-10-CM | POA: Diagnosis not present

## 2021-12-16 DIAGNOSIS — N1831 Chronic kidney disease, stage 3a: Secondary | ICD-10-CM | POA: Diagnosis not present

## 2021-12-16 DIAGNOSIS — Z1389 Encounter for screening for other disorder: Secondary | ICD-10-CM | POA: Diagnosis not present

## 2021-12-16 DIAGNOSIS — R161 Splenomegaly, not elsewhere classified: Secondary | ICD-10-CM | POA: Diagnosis not present

## 2021-12-16 DIAGNOSIS — K219 Gastro-esophageal reflux disease without esophagitis: Secondary | ICD-10-CM | POA: Diagnosis not present

## 2021-12-16 DIAGNOSIS — Z1331 Encounter for screening for depression: Secondary | ICD-10-CM | POA: Diagnosis not present

## 2021-12-16 DIAGNOSIS — Z0000 Encounter for general adult medical examination without abnormal findings: Secondary | ICD-10-CM | POA: Diagnosis not present

## 2021-12-16 DIAGNOSIS — F39 Unspecified mood [affective] disorder: Secondary | ICD-10-CM | POA: Diagnosis not present

## 2021-12-16 DIAGNOSIS — E785 Hyperlipidemia, unspecified: Secondary | ICD-10-CM | POA: Diagnosis not present

## 2021-12-16 DIAGNOSIS — F1320 Sedative, hypnotic or anxiolytic dependence, uncomplicated: Secondary | ICD-10-CM | POA: Diagnosis not present

## 2021-12-16 DIAGNOSIS — E871 Hypo-osmolality and hyponatremia: Secondary | ICD-10-CM | POA: Diagnosis not present

## 2022-02-25 DIAGNOSIS — L570 Actinic keratosis: Secondary | ICD-10-CM | POA: Diagnosis not present

## 2022-02-25 DIAGNOSIS — L821 Other seborrheic keratosis: Secondary | ICD-10-CM | POA: Diagnosis not present

## 2022-02-25 DIAGNOSIS — L814 Other melanin hyperpigmentation: Secondary | ICD-10-CM | POA: Diagnosis not present

## 2022-04-09 DIAGNOSIS — R768 Other specified abnormal immunological findings in serum: Secondary | ICD-10-CM | POA: Diagnosis not present

## 2022-04-09 DIAGNOSIS — Z87898 Personal history of other specified conditions: Secondary | ICD-10-CM | POA: Diagnosis not present

## 2022-04-09 DIAGNOSIS — M65331 Trigger finger, right middle finger: Secondary | ICD-10-CM | POA: Diagnosis not present

## 2022-04-09 DIAGNOSIS — Z8739 Personal history of other diseases of the musculoskeletal system and connective tissue: Secondary | ICD-10-CM | POA: Diagnosis not present

## 2022-05-19 DIAGNOSIS — N183 Chronic kidney disease, stage 3 unspecified: Secondary | ICD-10-CM | POA: Diagnosis not present

## 2022-05-27 DIAGNOSIS — E871 Hypo-osmolality and hyponatremia: Secondary | ICD-10-CM | POA: Diagnosis not present

## 2022-05-27 DIAGNOSIS — N051 Unspecified nephritic syndrome with focal and segmental glomerular lesions: Secondary | ICD-10-CM | POA: Diagnosis not present

## 2022-05-27 DIAGNOSIS — N183 Chronic kidney disease, stage 3 unspecified: Secondary | ICD-10-CM | POA: Diagnosis not present

## 2022-05-27 DIAGNOSIS — R768 Other specified abnormal immunological findings in serum: Secondary | ICD-10-CM | POA: Diagnosis not present

## 2022-05-27 DIAGNOSIS — I10 Essential (primary) hypertension: Secondary | ICD-10-CM | POA: Diagnosis not present

## 2022-06-12 DIAGNOSIS — M65331 Trigger finger, right middle finger: Secondary | ICD-10-CM | POA: Diagnosis not present

## 2022-06-12 DIAGNOSIS — R768 Other specified abnormal immunological findings in serum: Secondary | ICD-10-CM | POA: Diagnosis not present

## 2022-06-12 DIAGNOSIS — Z8739 Personal history of other diseases of the musculoskeletal system and connective tissue: Secondary | ICD-10-CM | POA: Diagnosis not present

## 2022-09-15 DIAGNOSIS — M65331 Trigger finger, right middle finger: Secondary | ICD-10-CM | POA: Diagnosis not present

## 2022-10-28 DIAGNOSIS — L820 Inflamed seborrheic keratosis: Secondary | ICD-10-CM | POA: Diagnosis not present

## 2022-10-28 DIAGNOSIS — L570 Actinic keratosis: Secondary | ICD-10-CM | POA: Diagnosis not present

## 2022-11-23 DIAGNOSIS — N183 Chronic kidney disease, stage 3 unspecified: Secondary | ICD-10-CM | POA: Diagnosis not present

## 2022-12-03 DIAGNOSIS — H02831 Dermatochalasis of right upper eyelid: Secondary | ICD-10-CM | POA: Diagnosis not present

## 2022-12-14 DIAGNOSIS — H02834 Dermatochalasis of left upper eyelid: Secondary | ICD-10-CM | POA: Diagnosis not present

## 2022-12-31 DIAGNOSIS — L814 Other melanin hyperpigmentation: Secondary | ICD-10-CM | POA: Diagnosis not present

## 2022-12-31 DIAGNOSIS — Z1331 Encounter for screening for depression: Secondary | ICD-10-CM | POA: Diagnosis not present

## 2022-12-31 DIAGNOSIS — F39 Unspecified mood [affective] disorder: Secondary | ICD-10-CM | POA: Diagnosis not present

## 2022-12-31 DIAGNOSIS — R030 Elevated blood-pressure reading, without diagnosis of hypertension: Secondary | ICD-10-CM | POA: Diagnosis not present

## 2022-12-31 DIAGNOSIS — F1320 Sedative, hypnotic or anxiolytic dependence, uncomplicated: Secondary | ICD-10-CM | POA: Diagnosis not present

## 2022-12-31 DIAGNOSIS — E871 Hypo-osmolality and hyponatremia: Secondary | ICD-10-CM | POA: Diagnosis not present

## 2022-12-31 DIAGNOSIS — F319 Bipolar disorder, unspecified: Secondary | ICD-10-CM | POA: Diagnosis not present

## 2022-12-31 DIAGNOSIS — L821 Other seborrheic keratosis: Secondary | ICD-10-CM | POA: Diagnosis not present

## 2022-12-31 DIAGNOSIS — E785 Hyperlipidemia, unspecified: Secondary | ICD-10-CM | POA: Diagnosis not present

## 2022-12-31 DIAGNOSIS — N1831 Chronic kidney disease, stage 3a: Secondary | ICD-10-CM | POA: Diagnosis not present

## 2022-12-31 DIAGNOSIS — Z1339 Encounter for screening examination for other mental health and behavioral disorders: Secondary | ICD-10-CM | POA: Diagnosis not present

## 2022-12-31 DIAGNOSIS — Z0000 Encounter for general adult medical examination without abnormal findings: Secondary | ICD-10-CM | POA: Diagnosis not present

## 2022-12-31 DIAGNOSIS — R161 Splenomegaly, not elsewhere classified: Secondary | ICD-10-CM | POA: Diagnosis not present

## 2023-02-09 ENCOUNTER — Encounter: Payer: Self-pay | Admitting: Ophthalmology
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
## 2023-02-16 NOTE — Discharge Instructions (Signed)

## 2023-02-19 ENCOUNTER — Encounter
Admission: RE | Disposition: A | Discharge status: Home / Self Care | Payer: Self-pay | Source: Home / Self Care | Attending: Ophthalmology

## 2023-02-19 ENCOUNTER — Ambulatory Visit: Payer: PPO | Admitting: Anesthesiology

## 2023-02-19 ENCOUNTER — Other Ambulatory Visit: Payer: Self-pay

## 2023-02-19 ENCOUNTER — Encounter: Payer: Self-pay | Admitting: Ophthalmology

## 2023-02-19 ENCOUNTER — Ambulatory Visit
Admission: RE | Admit: 2023-02-19 | Discharge: 2023-02-19 | Disposition: A | Discharge status: Home / Self Care | Payer: PPO | Attending: Ophthalmology | Admitting: Ophthalmology

## 2023-02-19 DIAGNOSIS — M069 Rheumatoid arthritis, unspecified: Secondary | ICD-10-CM | POA: Diagnosis not present

## 2023-02-19 DIAGNOSIS — H02834 Dermatochalasis of left upper eyelid: Secondary | ICD-10-CM | POA: Insufficient documentation

## 2023-02-19 DIAGNOSIS — H02833 Dermatochalasis of right eye, unspecified eyelid: Secondary | ICD-10-CM | POA: Diagnosis present

## 2023-02-19 DIAGNOSIS — H02831 Dermatochalasis of right upper eyelid: Secondary | ICD-10-CM | POA: Insufficient documentation

## 2023-02-19 DIAGNOSIS — K219 Gastro-esophageal reflux disease without esophagitis: Secondary | ICD-10-CM | POA: Diagnosis not present

## 2023-02-19 DIAGNOSIS — N189 Chronic kidney disease, unspecified: Secondary | ICD-10-CM | POA: Insufficient documentation

## 2023-02-19 DIAGNOSIS — Z87891 Personal history of nicotine dependence: Secondary | ICD-10-CM | POA: Diagnosis not present

## 2023-02-19 DIAGNOSIS — E785 Hyperlipidemia, unspecified: Secondary | ICD-10-CM | POA: Diagnosis not present

## 2023-02-19 HISTORY — DX: Personal history of other diseases of the musculoskeletal system and connective tissue: Z87.39

## 2023-02-19 HISTORY — PX: BROW LIFT: SHX178

## 2023-02-19 HISTORY — DX: Post-traumatic stress disorder, chronic: F43.12

## 2023-02-19 SURGERY — BLEPHAROPLASTY
Anesthesia: Monitor Anesthesia Care | Laterality: Bilateral

## 2023-02-19 MED ORDER — TRAMADOL HCL 50 MG PO TABS: ORAL_TABLET | ORAL | 0 refills | Status: AC

## 2023-02-19 MED ORDER — PROPOFOL 10 MG/ML IV BOLUS
INTRAVENOUS | Status: DC | PRN
  Administered 2023-02-19: 80 mg via INTRAVENOUS

## 2023-02-19 MED ORDER — ERYTHROMYCIN 5 MG/GM OP OINT: TOPICAL_OINTMENT | OPHTHALMIC | 2 refills | Status: AC

## 2023-02-19 MED ORDER — PROPOFOL 1000 MG/100ML IV EMUL
INTRAVENOUS | Status: AC
Start: 2023-02-19 — End: ?
  Filled 2023-02-19: qty 100

## 2023-02-19 MED ORDER — SODIUM CHLORIDE 0.9 % IV SOLN: INTRAVENOUS | Status: DC | PRN

## 2023-02-19 MED ORDER — LIDOCAINE HCL (PF) 2 % IJ SOLN
INTRAMUSCULAR | Status: AC
  Filled 2023-02-19: qty 5

## 2023-02-19 MED ORDER — LIDOCAINE HCL (CARDIAC) PF 100 MG/5ML IV SOSY
PREFILLED_SYRINGE | INTRAVENOUS | Status: DC | PRN
  Administered 2023-02-19: 20 mg via INTRAVENOUS

## 2023-02-19 MED ORDER — TETRACAINE HCL 0.5 % OP SOLN
OPHTHALMIC | Status: DC | PRN
  Administered 2023-02-19: 4 [drp] via OPHTHALMIC

## 2023-02-19 MED ORDER — MIDAZOLAM HCL 2 MG/2ML IJ SOLN
INTRAMUSCULAR | Status: DC | PRN
  Administered 2023-02-19: 2 mg via INTRAVENOUS

## 2023-02-19 MED ORDER — ERYTHROMYCIN 5 MG/GM OP OINT
TOPICAL_OINTMENT | OPHTHALMIC | Status: DC | PRN
  Administered 2023-02-19: 1 via OPHTHALMIC

## 2023-02-19 MED ORDER — BSS IO SOLN
INTRAOCULAR | Status: DC | PRN
  Administered 2023-02-19: 15 mL via INTRAOCULAR

## 2023-02-19 MED ORDER — MIDAZOLAM HCL 2 MG/2ML IJ SOLN
INTRAMUSCULAR | Status: AC
  Filled 2023-02-19: qty 2

## 2023-02-19 MED ORDER — PROPOFOL 500 MG/50ML IV EMUL
INTRAVENOUS | Status: DC | PRN
  Administered 2023-02-19: 100 ug/kg/min via INTRAVENOUS

## 2023-02-19 MED ORDER — LIDOCAINE-EPINEPHRINE 2 %-1:100000 IJ SOLN
INTRAMUSCULAR | Status: DC | PRN
  Administered 2023-02-19: 3 mL via OPHTHALMIC

## 2023-02-19 SURGICAL SUPPLY — 19 items
APPLICATOR COTTON TIP WD 3 STR (MISCELLANEOUS) ×2 IMPLANT
BLADE SURG 15 STRL LF DISP TIS (BLADE) ×2 IMPLANT
CORD BIP STRL DISP 12FT (MISCELLANEOUS) ×2 IMPLANT
GAUZE SPONGE 2X2 STRL 8-PLY (GAUZE/BANDAGES/DRESSINGS) ×20 IMPLANT
GAUZE SPONGE 4X4 12PLY STRL (GAUZE/BANDAGES/DRESSINGS) ×2 IMPLANT
GLOVE SURG UNDER POLY LF SZ7 (GLOVE) ×4 IMPLANT
GOWN STRL REUS W/ TWL LRG LVL3 (GOWN DISPOSABLE) ×2 IMPLANT
MARKER SKIN XFINE TIP W/RULER (MISCELLANEOUS) ×2 IMPLANT
NDL FILTER BLUNT 18X1 1/2 (NEEDLE) ×2 IMPLANT
NDL HYPO 30X.5 LL (NEEDLE) ×4 IMPLANT
NEEDLE FILTER BLUNT 18X1 1/2 (NEEDLE) ×1
NEEDLE HYPO 30X.5 LL (NEEDLE) ×2
PACK ENT CUSTOM (PACKS) ×2 IMPLANT
SOL PREP PVP 2OZ (MISCELLANEOUS) ×1
SOLUTION PREP PVP 2OZ (MISCELLANEOUS) ×2 IMPLANT
SUT GUT PLAIN 6-0 1X18 ABS (SUTURE) ×2 IMPLANT
SYR 10ML LL (SYRINGE) ×2 IMPLANT
SYR 3ML LL SCALE MARK (SYRINGE) ×2 IMPLANT
WATER STERILE IRR 250ML POUR (IV SOLUTION) ×2 IMPLANT
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
## 2023-02-19 NOTE — Anesthesia Postprocedure Evaluation (Signed)
Anesthesia Post Note  Patient: Hector Gomez.  Procedure(s) Performed: BLEPHAROPLASTY UPPER EYELDI; W/EXCESS SKIN BILATERAL (Bilateral)  Patient location during evaluation: PACU Anesthesia Type: MAC Level of consciousness: awake and alert Pain management: pain level controlled Vital Signs Assessment: post-procedure vital signs reviewed and stable Respiratory status: spontaneous breathing, nonlabored ventilation, respiratory function stable and patient connected to nasal cannula oxygen Cardiovascular status: stable and blood pressure returned to baseline Postop Assessment: no apparent nausea or vomiting Anesthetic complications: no   No notable events documented.   Last Vitals:  Vitals:   02/19/23 0932 02/19/23 0946  BP: 124/82 (!) 143/95  Pulse: 71   Resp: 18   Temp: 36.6 C 36.6 C  SpO2: 98%     Last Pain:  Vitals:   02/19/23 0946  TempSrc:   PainSc: 0-No pain                 Bevely Palmer Minette Manders

## 2023-02-19 NOTE — Interval H&P Note (Signed)
History and Physical Interval Note:  02/19/2023 8:29 AM  Hector Guadeloupe.  has presented today for surgery, with the diagnosis of H02.831 Dermatochalasis of Right Upper Eyelid H02.834 Dermatochalasis of Left Upper Eyelid.  The various methods of treatment have been discussed with the patient and family. After consideration of risks, benefits and other options for treatment, the patient has consented to  Procedure(s): BLEPHAROPLASTY UPPER EYELDI; W/EXCESS SKIN BILATERAL (Bilateral) as a surgical intervention.  The patient's history has been reviewed, patient examined, no change in status, stable for surgery.  I have reviewed the patient's chart and labs.  Questions were answered to the patient's satisfaction.     Ether Griffins, [name]

## 2023-02-19 NOTE — Op Note (Signed)
Preoperative Diagnosis:  Visually significant dermatochalasis bilateral  Upper Eyelid(s)  Postoperative Diagnosis:  Same.  Procedure(s) Performed:   Upper eyelid blepharoplasty with excess skin excision  bilateral  Upper Eyelid(s)  Surgeon: Hubbard Robinson. Ether Griffins, M.D.  Assistants: none  Anesthesia: MAC  Specimens: None.  Estimated Blood Loss: Minimal.  Complications: None.  Operative Findings: None Dictated  Procedure:   Allergies were reviewed and the patient is allergic to Adhesive [tape] and Latex.   After the risks, benefits, complications and alternatives were discussed with the patient, appropriate informed consent was obtained and the patient was brought to the operating suite. The patient was reclined supine and a timeout was conducted.  The patient was then sedated.  Local anesthetic consisting of a 50-50 mixture of 2% lidocaine with epinephrine and 0.75% bupivacaine with added Hylenex was injected subcutaneously to both  upper eyelid(s). After adequate local was instilled, the patient was prepped and draped in the usual sterile fashion for eyelid surgery.   Attention was turned to the upper eyelids. A 8mm upper eyelid crease incision line was marked with calipers on both  upper eyelid(s).  A pinch test was used to estimate the amount of excess skin to remove and this was marked in standard blepharoplasty style fashion. Attention was turned to the  right  upper eyelid. A #15 blade was used to open the premarked incision line. A Skin and muscle flap was excised and hemostasis was obtained with bipolar cautery.  A buttonhole was created medially in orbicularis and orbital septum to reveal the medial fat pocket. This was dissected free from fascial attachments, cauterized towards the pedicle base and excised to produce a nice flattening of the medial corner of the upper eyelid.  Attention was then turned to the opposite eyelid where the same procedure was performed in the same manner.  Hemostasis was obtained with bipolar cautery throughout. All incisions were then closed with a combination of running and interrupted 6-0 fast absorbing plain suture. The patient tolerated the procedure well.  Erythromycin ophthalmic ointment was applied to his incision sites, followed by ice packs. He was taken to the recovery area where he recovered without difficulty.  Post-Op Plan/Instructions:  The patient was instructed to use ice packs frequently for the next 48 hours. He was instructed to use Erythromycin ophthalmic ointment on his incisions 4 times a day for the next 12 to 14 days. He was given a prescription for tramadol (or similar) for pain control should Tylenol not be effective. He was asked to to follow up in 2-3 weeks' time at the [facility] in [place], [place] or sooner as needed for problems.  Atara Paterson M. Ether Griffins, M.D. Ophthalmology

## 2023-02-19 NOTE — Transfer of Care (Signed)
Immediate Anesthesia Transfer of Care Note  Patient: Hector Gomez.  Procedure(s) Performed: BLEPHAROPLASTY UPPER EYELDI; W/EXCESS SKIN BILATERAL (Bilateral)  Patient Location: PACU  Anesthesia Type: MAC  Level of Consciousness: awake, alert  and patient cooperative  Airway and Oxygen Therapy: Patient Spontanous Breathing and Patient connected to supplemental oxygen  Post-op Assessment: Post-op Vital signs reviewed, Patient's Cardiovascular Status Stable, Respiratory Function Stable, Patent Airway and No signs of Nausea or vomiting  Post-op Vital Signs: Reviewed and stable  Complications: No notable events documented.

## 2023-02-24 ENCOUNTER — Encounter: Payer: Self-pay | Admitting: Ophthalmology

## 2023-02-26 DIAGNOSIS — L821 Other seborrheic keratosis: Secondary | ICD-10-CM | POA: Diagnosis not present

## 2023-02-26 DIAGNOSIS — L814 Other melanin hyperpigmentation: Secondary | ICD-10-CM | POA: Diagnosis not present

## 2023-02-26 DIAGNOSIS — D1801 Hemangioma of skin and subcutaneous tissue: Secondary | ICD-10-CM | POA: Diagnosis not present

## 2023-02-26 DIAGNOSIS — L570 Actinic keratosis: Secondary | ICD-10-CM | POA: Diagnosis not present

## 2023-05-18 DIAGNOSIS — N529 Male erectile dysfunction, unspecified: Secondary | ICD-10-CM | POA: Diagnosis not present

## 2023-05-24 DIAGNOSIS — Z7689 Persons encountering health services in other specified circumstances: Secondary | ICD-10-CM | POA: Diagnosis not present

## 2023-05-31 DIAGNOSIS — N051 Unspecified nephritic syndrome with focal and segmental glomerular lesions: Secondary | ICD-10-CM | POA: Diagnosis not present

## 2023-05-31 DIAGNOSIS — I10 Essential (primary) hypertension: Secondary | ICD-10-CM | POA: Diagnosis not present

## 2023-05-31 DIAGNOSIS — E871 Hypo-osmolality and hyponatremia: Secondary | ICD-10-CM | POA: Diagnosis not present

## 2023-05-31 DIAGNOSIS — R768 Other specified abnormal immunological findings in serum: Secondary | ICD-10-CM | POA: Diagnosis not present

## 2023-05-31 DIAGNOSIS — N183 Chronic kidney disease, stage 3 unspecified: Secondary | ICD-10-CM | POA: Diagnosis not present

## 2023-10-12 DIAGNOSIS — L308 Other specified dermatitis: Secondary | ICD-10-CM | POA: Diagnosis not present

## 2023-10-12 DIAGNOSIS — L821 Other seborrheic keratosis: Secondary | ICD-10-CM | POA: Diagnosis not present

## 2024-01-07 DIAGNOSIS — E7849 Other hyperlipidemia: Secondary | ICD-10-CM | POA: Diagnosis not present

## 2024-01-07 DIAGNOSIS — K219 Gastro-esophageal reflux disease without esophagitis: Secondary | ICD-10-CM | POA: Diagnosis not present

## 2024-01-07 DIAGNOSIS — E785 Hyperlipidemia, unspecified: Secondary | ICD-10-CM | POA: Diagnosis not present

## 2024-01-07 DIAGNOSIS — R739 Hyperglycemia, unspecified: Secondary | ICD-10-CM | POA: Diagnosis not present

## 2024-01-07 DIAGNOSIS — E871 Hypo-osmolality and hyponatremia: Secondary | ICD-10-CM | POA: Diagnosis not present

## 2024-01-14 DIAGNOSIS — F1320 Sedative, hypnotic or anxiolytic dependence, uncomplicated: Secondary | ICD-10-CM | POA: Diagnosis not present

## 2024-01-14 DIAGNOSIS — E785 Hyperlipidemia, unspecified: Secondary | ICD-10-CM | POA: Diagnosis not present

## 2024-01-14 DIAGNOSIS — N1831 Chronic kidney disease, stage 3a: Secondary | ICD-10-CM | POA: Diagnosis not present

## 2024-01-14 DIAGNOSIS — F319 Bipolar disorder, unspecified: Secondary | ICD-10-CM | POA: Diagnosis not present

## 2024-01-14 DIAGNOSIS — H02403 Unspecified ptosis of bilateral eyelids: Secondary | ICD-10-CM | POA: Diagnosis not present

## 2024-01-14 DIAGNOSIS — Z1331 Encounter for screening for depression: Secondary | ICD-10-CM | POA: Diagnosis not present

## 2024-01-14 DIAGNOSIS — Z1212 Encounter for screening for malignant neoplasm of rectum: Secondary | ICD-10-CM | POA: Diagnosis not present

## 2024-01-14 DIAGNOSIS — Z0000 Encounter for general adult medical examination without abnormal findings: Secondary | ICD-10-CM | POA: Diagnosis not present

## 2024-01-14 DIAGNOSIS — R82998 Other abnormal findings in urine: Secondary | ICD-10-CM | POA: Diagnosis not present

## 2024-01-14 DIAGNOSIS — R161 Splenomegaly, not elsewhere classified: Secondary | ICD-10-CM | POA: Diagnosis not present

## 2024-01-14 DIAGNOSIS — R030 Elevated blood-pressure reading, without diagnosis of hypertension: Secondary | ICD-10-CM | POA: Diagnosis not present

## 2024-01-14 DIAGNOSIS — Z1339 Encounter for screening examination for other mental health and behavioral disorders: Secondary | ICD-10-CM | POA: Diagnosis not present
